# Patient Record
Sex: Female | Born: 1970 | Race: Black or African American | Hispanic: No | Marital: Married | Smoking: Never smoker
Health system: Southern US, Community
[De-identification: ages and names within clinical notes are randomized; demographics above are authoritative.]

## PROBLEM LIST (undated history)

## (undated) DIAGNOSIS — G44229 Chronic tension-type headache, not intractable: Secondary | ICD-10-CM

## (undated) HISTORY — PX: WISDOM TOOTH EXTRACTION: SHX21

## (undated) HISTORY — DX: Chronic tension-type headache, not intractable: G44.229

---

## 2009-09-29 ENCOUNTER — Ambulatory Visit (HOSPITAL_COMMUNITY): Admission: RE | Admit: 2009-09-29 | Discharge: 2009-09-29 | Payer: Self-pay | Admitting: Obstetrics and Gynecology

## 2009-11-03 ENCOUNTER — Inpatient Hospital Stay (HOSPITAL_COMMUNITY): Admission: AD | Admit: 2009-11-03 | Discharge: 2009-11-03 | Payer: Self-pay | Admitting: Obstetrics and Gynecology

## 2009-11-06 ENCOUNTER — Inpatient Hospital Stay (HOSPITAL_COMMUNITY): Admission: AD | Admit: 2009-11-06 | Discharge: 2009-11-06 | Payer: Self-pay | Admitting: Obstetrics and Gynecology

## 2009-11-09 ENCOUNTER — Ambulatory Visit (HOSPITAL_COMMUNITY): Admission: RE | Admit: 2009-11-09 | Discharge: 2009-11-09 | Payer: Self-pay | Admitting: Obstetrics and Gynecology

## 2009-11-16 ENCOUNTER — Ambulatory Visit (HOSPITAL_COMMUNITY): Admission: RE | Admit: 2009-11-16 | Discharge: 2009-11-16 | Payer: Self-pay | Admitting: Obstetrics and Gynecology

## 2009-11-17 DEATH — deceased

## 2009-11-22 ENCOUNTER — Ambulatory Visit (HOSPITAL_COMMUNITY): Admission: RE | Admit: 2009-11-22 | Discharge: 2009-11-22 | Payer: Self-pay | Admitting: Obstetrics and Gynecology

## 2009-11-29 ENCOUNTER — Ambulatory Visit (HOSPITAL_COMMUNITY): Admission: RE | Admit: 2009-11-29 | Discharge: 2009-11-29 | Payer: Self-pay | Admitting: Obstetrics and Gynecology

## 2009-12-06 ENCOUNTER — Ambulatory Visit
Admission: RE | Admit: 2009-12-06 | Discharge: 2009-12-06 | Payer: Self-pay | Source: Home / Self Care | Admitting: Obstetrics and Gynecology

## 2010-01-17 DIAGNOSIS — O009 Unspecified ectopic pregnancy without intrauterine pregnancy: Secondary | ICD-10-CM | POA: Insufficient documentation

## 2010-01-17 HISTORY — DX: Unspecified ectopic pregnancy without intrauterine pregnancy: O00.90

## 2010-03-18 HISTORY — PX: SALPINGECTOMY: SHX328

## 2010-03-19 ENCOUNTER — Encounter (HOSPITAL_COMMUNITY)
Admission: RE | Admit: 2010-03-19 | Discharge: 2010-03-19 | Disposition: A | Discharge status: Home / Self Care | Payer: 59 | Source: Ambulatory Visit | Attending: Obstetrics and Gynecology | Admitting: Obstetrics and Gynecology

## 2010-03-19 DIAGNOSIS — Z01812 Encounter for preprocedural laboratory examination: Secondary | ICD-10-CM | POA: Insufficient documentation

## 2010-03-19 LAB — SURGICAL PCR SCREEN
MRSA, PCR: NEGATIVE
Staphylococcus aureus: NEGATIVE

## 2010-03-19 LAB — CBC
HCT: 39.2 % (ref 36.0–46.0)
RDW: 14.4 % (ref 11.5–15.5)
WBC: 6 10*3/uL (ref 4.0–10.5)

## 2010-03-26 ENCOUNTER — Other Ambulatory Visit: Payer: Self-pay | Admitting: Obstetrics and Gynecology

## 2010-03-26 ENCOUNTER — Ambulatory Visit (HOSPITAL_COMMUNITY)
Admission: RE | Admit: 2010-03-26 | Discharge: 2010-03-26 | Disposition: A | Discharge status: Home / Self Care | Payer: 59 | Source: Ambulatory Visit | Attending: Obstetrics and Gynecology | Admitting: Obstetrics and Gynecology

## 2010-03-26 DIAGNOSIS — Q519 Congenital malformation of uterus and cervix, unspecified: Secondary | ICD-10-CM | POA: Insufficient documentation

## 2010-03-26 LAB — PREGNANCY, URINE: Preg Test, Ur: NEGATIVE

## 2010-03-27 NOTE — H&P (Signed)
Joyce Russell, SUBLETTE                ACCOUNT NO.:  [id number]  MEDICAL RECORD NO.:  [id number]           PATIENT TYPE:  O  LOCATION:  [facility]                           FACILITY:  [facility]  PHYSICIAN:  Crist Fat. Carry Weesner, M.D. DATE OF BIRTH:  21-Mar-1970  DATE OF ADMISSION:  03/19/2010 DATE OF DISCHARGE:  03/19/2010                             HISTORY & PHYSICAL   HISTORY OF PRESENT ILLNESS:  Joyce Russell is a 40 year old married African female para 2-0-1-2, presenting for laparoscopy and right salpingectomy because of a right tubal phimosis and previous history of a right ectopic.  In 2011, the patient underwent evaluation for infertility.  In October of that year, she conceived spontaneously but was found to have a right ectopic that was managed with methotrexate. In view of her desire to conceive again and her previous hysterosalpingogram findings that were consistent with a right paratubal adhesion, the patient has consented to proceed with laparoscopy and right salpingectomy.  OB HISTORY:  Gravida 3, para 2-0-1-2.  GYN HISTORY:  Menarche at 40 years old.  Last menstrual period March 06, 2010.  She currently uses condoms for contraception.  Denies any history of sexually transmitted diseases or abnormal Pap smears.  Last Pap smear was July 2011.  MEDICAL HISTORY:  Migraines.  SURGICAL HISTORY:  Negative.  Denies any history of blood transfusions.  FAMILY HISTORY:  Hypertension, non-insulin-dependent diabetes, stroke, and ovarian cancer.  HABITS:  The patient denies any tobacco, alcohol, or illicit drug use.  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  CURRENT MEDICATIONS: 1. Multivitamins 1 tablet daily. 2. Motrin (over the counter) as needed.  The patient has no known drug allergies.  She further denies any sensitivities to latex, soy, shellfish, or peanuts.  REVIEW OF SYSTEMS:  The patient does have occasional headaches,  however, she denies any chest pain, shortness of breath, pelvic pain, nausea, vomiting, diarrhea, fever, changes in her bowel habits, joint swelling, pain, myalgias, arthralgias, and except as is mentioned in history of present illness, the patient's review of systems is otherwise negative.  PHYSICAL EXAMINATION:  VITAL SIGNS:  Blood pressure 112/72, pulse is 86, respirations 16, temperature 98.4 degrees Fahrenheit orally, weight 165 pounds, height 5 feet 4 inches tall, body mass index 28. NECK:  Supple without masses.  There is no thyromegaly or cervical adenopathy. HEART:  Regular rate and rhythm. LUNGS:  Clear. BACK:  No CVA tenderness. ABDOMEN:  No tenderness, masses, guarding, rebound, or organomegaly. EXTREMITIES:  No clubbing, cyanosis, or edema. PELVIC:  EG/BUS is normal.  Vagina is normal.  Cervix is nontender without lesions.  Uterus appears normal size, shape, and consistency without tenderness.  Adnexa without tenderness or masses.  IMPRESSION: 1. Right tubal phimosis. 2. Status post right ectopic pregnancy  DISPOSITION:  A discussion was held with the patient regarding the indications for her procedures along with their risks, which include but are not limited to, reaction to anesthesia, damage to adjacent organs, infection, and excessive bleeding.  The patient verbalized understanding of these risks and has consented to proceed with laparoscopy followed  by a right salpingectomy at [facility] on March 26, 2010, at 9 o'clock a.m.     Elmira J. Lowell Guitar, P.A.-C   ______________________________ Crist Fat Laurena Valko, M.D.    EJP/MEDQ  D:  03/19/2010  T:  03/20/2010  Job:  [id number]  Electronically Signed by Raylene Everts. on 03/22/2010 10:30:19 AM Electronically Signed by Silverio Lay M.D. on 03/27/2010 02:50:55 PM

## 2010-03-27 NOTE — Op Note (Signed)
Joyce Russell, Joyce Russell                ACCOUNT NO.:  [id number]  MEDICAL RECORD NO.:  [id number]           PATIENT TYPE:  O  LOCATION:  [facility]                          [facility]  [facility]  PHYSICIAN:  Crist Fat. Davonte Siebenaler, M.D. DATE OF BIRTH:  08-10-1970  DATE OF PROCEDURE:  03/26/2010 DATE OF DISCHARGE:                              OPERATIVE REPORT   PREOPERATIVE DIAGNOSIS:  Right tubal phimosis status post right tubal pregnancy.  POSTOPERATIVE DIAGNOSIS:  Right tubal phimosis status post right tubal pregnancy.  ANESTHESIA:  General, Dr. Cristela Blue.  PROCEDURE:  Laparoscopy with right salpingectomy and chromopertubation.  SURGEON:  Crist Fat. Lanay Zinda, MD.  ASSISTANT:  Elmira J. Lowell Guitar, PA.  ESTIMATED BLOOD LOSS:  Minimal.  PROCEDURE:  After being informed of the planned procedure with possible complications including bleeding, infection, injury to other organs, need for laparotomy, informed consent was obtained.  The patient was taken to OR #4, given general anesthesia with endotracheal intubation without any complication.  The patient was then placed in a lithotomy position, prepped and draped in a sterile fashion.  A Foley catheter was inserted in her bladder and a acorn intrauterine manipulator was placed with a tenaculum on the anterior lip of the cervix.  Pelvic exam previously showed a normal uterus and normal adnexa.  We infiltrated the umbilical area with 5 mL of Marcaine 0.25 and performed a semi elliptical incision which was brought down sharply to the fascia.  The fascia was then grasped with Kocher forceps, incised with Mayo scissors.  Peritoneum was entered bluntly.  A pursestring suture of 0 Vicryl was placed on the fascia and a 10-mm Hassan trocar was inserted in the abdominal cavity, held in place with its inflated balloon as well as the pursestring suture.  This allows for easy insufflation of a pneumoperitoneum using warmed CO2 at a maximum pressure of 15 mmHg.   After a quick look we placed a 5-mm suprapubic trocar and a 5-mm right lower quadrant trocar under direct visualization and after infiltration with Marcaine 0.25.  Observation:  Anterior cul-de-sac was normal, posterior cul-de-sac was normal, uterus was normal, right tube was slightly tortuous but free with a normal fimbriae, right ovary was normal left tube was normal with a normal fimbriae, left ovary was normal.  There were no adhesions. Appendix was normal.  Liver was normal.  Gallbladder was normal.  Using our acorn manipulator, we then proceeded with chromopertubation which confirmed our hysterosalpingogram diagnosis with a normal patency and passage in the left tube and collection with hydrosalpinx in the distal portion of the right tube which eventually with extra pressure will leak slightly.  Using a PK gyrus forceps, we then cauterized the tube at the junction with the uterus and sectioned it.  We then cauterized and sectioned the mesosalpinx until the full tube was completely removed through the suprapubic incision.  We then irrigated with saline and noticed satisfactory hemostasis.  All trocars were then removed after evacuating the pneumoperitoneum. The fascia of the umbilical incision was closed with a pursestring suture of 0 Vicryl.  The skin of all 3 incision was then closed with  Dermabond.  Intrauterine manipulator was removed and examination of the cervix revealed bleeding at the site of the tenaculum which was controlled with two figure-of-eight eight stitches of 3-0 Vicryl.  Instrument and sponge count was complete x2.  Estimated blood loss was minimal.  The procedure was well tolerated by the patient who was taken to recovery room in a well and stable condition.  SPECIMEN:  Right tube sent to Pathology.  The procedure was very well tolerated by the patient who was taken to recovery room in a well and stable condition.     Crist Fat Nanie Dunkleberger,  M.D.     SAR/MEDQ  D:  03/26/2010  T:  03/27/2010  Job:  [id number]  Electronically Signed by Silverio Lay M.D. on 03/27/2010 02:51:05 PM

## 2010-03-30 LAB — CBC
MCH: 28.1 pg (ref 26.0–34.0)
MCHC: 33.6 g/dL (ref 30.0–36.0)
MCV: 83.8 fL (ref 78.0–100.0)
Platelets: 267 10*3/uL (ref 150–400)
RDW: 15.7 % — ABNORMAL HIGH (ref 11.5–15.5)
WBC: 5.4 10*3/uL (ref 4.0–10.5)

## 2010-03-31 LAB — CBC
HCT: 39.9 % (ref 36.0–46.0)
MCH: 28 pg (ref 26.0–34.0)
MCHC: 33.4 g/dL (ref 30.0–36.0)
MCV: 83.8 fL (ref 78.0–100.0)
RDW: 15.5 % (ref 11.5–15.5)

## 2010-03-31 LAB — DIFFERENTIAL
Eosinophils Relative: 1 % (ref 0–5)
Lymphocytes Relative: 13 % (ref 12–46)
Lymphs Abs: 1 10*3/uL (ref 0.7–4.0)
Monocytes Absolute: 0.5 10*3/uL (ref 0.1–1.0)
Monocytes Relative: 7 % (ref 3–12)
Neutro Abs: 6.1 10*3/uL (ref 1.7–7.7)

## 2010-03-31 LAB — HCG, QUANTITATIVE, PREGNANCY
hCG, Beta Chain, Quant, S: 3539 m[IU]/mL — ABNORMAL HIGH (ref <5)
hCG, Beta Chain, Quant, S: 7198 m[IU]/mL — ABNORMAL HIGH (ref <5)

## 2010-03-31 LAB — BUN: BUN: 11 mg/dL (ref 6–23)

## 2011-01-18 NOTE — L&D Delivery Note (Signed)
Delivery Note Following AROM at 1527, pt's ctxs cont'd spontaneously and became more intense.  FHT stable and WNL.  Around 1615, pt started to c/o more pressure.  Repositioned pt in tilted, and then lateral position, and cx progressed quickly to 10 cm.  Pushed great to SVD at 4:39 PM.  Secondary to particulate meconium, NICU paged for delivery, but did not arrive until 4 minutes after delivery.  A viable female "Joyce Russell" was delivered via Vaginal, Spontaneous Delivery (Presentation: ; Occiput Anterior).  Newborn w/ spontaneous cry as body delivered and placed on mom's chest.  Mouth and nares bulb suctioned.  APGAR: 9, 9; weight 6 lb 12.1 oz (3065 g).  Cord doubly clamped and cut by FOB.   Placenta status: Intact, Spontaneous, Schultz.  Cord: 3 vessels with the following complications: None.  Cord pH: n/a. Heavy vaginal bleeding following delivery of placenta, so of cytotec placed pr.   Anesthesia: Local  Episiotomy: None Lacerations: 2nd degree;Perineal Suture Repair: 3-0 monocryl Est. Blood Loss (mL):  Mom to postpartum.  Baby to nursery-stable. Breastfeeding initiated in immediate recovery.    Joyce Russell H 04/30/2011, 6:51 AM (Late entry)

## 2011-03-24 ENCOUNTER — Encounter (INDEPENDENT_AMBULATORY_CARE_PROVIDER_SITE_OTHER): Payer: 59 | Admitting: Obstetrics and Gynecology

## 2011-03-24 DIAGNOSIS — Z331 Pregnant state, incidental: Secondary | ICD-10-CM

## 2011-04-07 ENCOUNTER — Encounter (INDEPENDENT_AMBULATORY_CARE_PROVIDER_SITE_OTHER): Payer: 59 | Admitting: Obstetrics and Gynecology

## 2011-04-07 DIAGNOSIS — Z3480 Encounter for supervision of other normal pregnancy, unspecified trimester: Secondary | ICD-10-CM

## 2011-04-14 ENCOUNTER — Encounter (INDEPENDENT_AMBULATORY_CARE_PROVIDER_SITE_OTHER): Payer: 59 | Admitting: Registered Nurse

## 2011-04-14 ENCOUNTER — Encounter: Payer: 59 | Admitting: Obstetrics and Gynecology

## 2011-04-14 DIAGNOSIS — K649 Unspecified hemorrhoids: Secondary | ICD-10-CM

## 2011-04-14 DIAGNOSIS — Z3480 Encounter for supervision of other normal pregnancy, unspecified trimester: Secondary | ICD-10-CM

## 2011-04-22 ENCOUNTER — Encounter (INDEPENDENT_AMBULATORY_CARE_PROVIDER_SITE_OTHER): Payer: 59 | Admitting: Registered Nurse

## 2011-04-22 ENCOUNTER — Encounter: Payer: 59 | Admitting: Obstetrics and Gynecology

## 2011-04-22 DIAGNOSIS — Z331 Pregnant state, incidental: Secondary | ICD-10-CM

## 2011-04-28 ENCOUNTER — Encounter: Payer: Self-pay | Admitting: Obstetrics and Gynecology

## 2011-04-28 ENCOUNTER — Ambulatory Visit (INDEPENDENT_AMBULATORY_CARE_PROVIDER_SITE_OTHER): Payer: 59 | Admitting: Obstetrics and Gynecology

## 2011-04-28 VITALS — BP 124/80 | Ht 63.0 in | Wt 188.0 lb

## 2011-04-28 DIAGNOSIS — Z331 Pregnant state, incidental: Secondary | ICD-10-CM

## 2011-04-28 DIAGNOSIS — O09529 Supervision of elderly multigravida, unspecified trimester: Secondary | ICD-10-CM

## 2011-04-28 NOTE — Progress Notes (Signed)
   The patient  complains of contractions since last night. This am more regular. Now irregular. No change in D/C. Pt desires to schedule IOL if possible on 05/04/11

## 2011-04-28 NOTE — Progress Notes (Signed)
PT C/O CXTS EVERY AFTER 6:OOAM TODAY TIL 9:30 AM. IRREGULAR CXTS SINCE THEN,

## 2011-04-29 ENCOUNTER — Inpatient Hospital Stay (HOSPITAL_COMMUNITY)
Admission: AD | Admit: 2011-04-29 | Discharge: 2011-05-01 | DRG: 774 | Disposition: A | Discharge status: Home / Self Care | Payer: 59 | Source: Ambulatory Visit | Attending: Obstetrics and Gynecology | Admitting: Obstetrics and Gynecology

## 2011-04-29 ENCOUNTER — Encounter (HOSPITAL_COMMUNITY): Payer: Self-pay | Admitting: *Deleted

## 2011-04-29 ENCOUNTER — Inpatient Hospital Stay (HOSPITAL_COMMUNITY): Payer: 59

## 2011-04-29 DIAGNOSIS — IMO0001 Reserved for inherently not codable concepts without codable children: Secondary | ICD-10-CM

## 2011-04-29 DIAGNOSIS — O09529 Supervision of elderly multigravida, unspecified trimester: Secondary | ICD-10-CM | POA: Diagnosis present

## 2011-04-29 DIAGNOSIS — Z8759 Personal history of other complications of pregnancy, childbirth and the puerperium: Secondary | ICD-10-CM

## 2011-04-29 DIAGNOSIS — O479 False labor, unspecified: Secondary | ICD-10-CM

## 2011-04-29 DIAGNOSIS — D649 Anemia, unspecified: Secondary | ICD-10-CM | POA: Diagnosis not present

## 2011-04-29 DIAGNOSIS — O9903 Anemia complicating the puerperium: Secondary | ICD-10-CM | POA: Diagnosis not present

## 2011-04-29 LAB — COMPREHENSIVE METABOLIC PANEL
ALT: 17 U/L (ref 0–35)
AST: 24 U/L (ref 0–37)
Albumin: 2.8 g/dL — ABNORMAL LOW (ref 3.5–5.2)
CO2: 20 mEq/L (ref 19–32)
Calcium: 9.1 mg/dL (ref 8.4–10.5)
Creatinine, Ser: 0.47 mg/dL — ABNORMAL LOW (ref 0.50–1.10)
Sodium: 135 mEq/L (ref 135–145)
Total Protein: 7 g/dL (ref 6.0–8.3)

## 2011-04-29 LAB — RPR
RPR Ser Ql: NONREACTIVE
RPR: NONREACTIVE

## 2011-04-29 LAB — CBC
Hemoglobin: 12.4 g/dL (ref 12.0–15.0)
MCH: 26.7 pg (ref 26.0–34.0)
MCHC: 33.2 g/dL (ref 30.0–36.0)
Platelets: 224 10*3/uL (ref 150–400)

## 2011-04-29 LAB — GC/CHLAMYDIA PROBE AMP, GENITAL

## 2011-04-29 LAB — ANTIBODY SCREEN: Antibody Screen: NEGATIVE

## 2011-04-29 LAB — STREP B DNA PROBE: GBS: NEGATIVE

## 2011-04-29 LAB — HIV ANTIBODY (ROUTINE TESTING W REFLEX): HIV: NONREACTIVE

## 2011-04-29 LAB — URIC ACID: Uric Acid, Serum: 4.1 mg/dL (ref 2.4–7.0)

## 2011-04-29 MED ORDER — MEDROXYPROGESTERONE ACETATE 150 MG/ML IM SUSP: 150.0000 mg | INTRAMUSCULAR | Status: DC | PRN

## 2011-04-29 MED ORDER — OXYCODONE-ACETAMINOPHEN 5-325 MG PO TABS
1.0000 | ORAL_TABLET | ORAL | Status: DC | PRN
  Administered 2011-04-29: 1 via ORAL
  Filled 2011-04-29: qty 1

## 2011-04-29 MED ORDER — PRENATAL MULTIVITAMIN CH
1.0000 | ORAL_TABLET | Freq: Every day | ORAL | Status: DC
  Administered 2011-04-29 – 2011-05-01 (×3): 1 via ORAL
  Filled 2011-04-29 (×2): qty 1

## 2011-04-29 MED ORDER — TETANUS-DIPHTH-ACELL PERTUSSIS 5-2.5-18.5 LF-MCG/0.5 IM SUSP
0.5000 mL | Freq: Once | INTRAMUSCULAR | Status: AC
  Administered 2011-05-01: 0.5 mL via INTRAMUSCULAR

## 2011-04-29 MED ORDER — MAGNESIUM HYDROXIDE 400 MG/5ML PO SUSP: 30.0000 mL | ORAL | Status: DC | PRN

## 2011-04-29 MED ORDER — OXYTOCIN 20 UNITS IN LACTATED RINGERS INFUSION - SIMPLE
125.0000 mL/h | Freq: Once | INTRAVENOUS | Status: AC
  Administered 2011-04-29: 999 mL/h via INTRAVENOUS

## 2011-04-29 MED ORDER — LIDOCAINE HCL (PF) 1 % IJ SOLN
30.0000 mL | INTRAMUSCULAR | Status: DC | PRN
  Administered 2011-04-29: 30 mL via SUBCUTANEOUS
  Filled 2011-04-29 (×2): qty 30

## 2011-04-29 MED ORDER — IBUPROFEN 600 MG PO TABS
600.0000 mg | ORAL_TABLET | Freq: Four times a day (QID) | ORAL | Status: DC
  Administered 2011-04-30 – 2011-05-01 (×6): 600 mg via ORAL
  Filled 2011-04-29 (×6): qty 1

## 2011-04-29 MED ORDER — CITRIC ACID-SODIUM CITRATE 334-500 MG/5ML PO SOLN: 30.0000 mL | ORAL | Status: DC | PRN

## 2011-04-29 MED ORDER — LACTATED RINGERS IV SOLN
INTRAVENOUS | Status: DC
  Administered 2011-04-29: 13:00:00 via INTRAVENOUS

## 2011-04-29 MED ORDER — WITCH HAZEL-GLYCERIN EX PADS: 1.0000 "application " | MEDICATED_PAD | CUTANEOUS | Status: DC | PRN

## 2011-04-29 MED ORDER — LANOLIN HYDROUS EX OINT: TOPICAL_OINTMENT | CUTANEOUS | Status: DC | PRN

## 2011-04-29 MED ORDER — HYDROXYZINE HCL 50 MG/ML IM SOLN: 50.0000 mg | Freq: Four times a day (QID) | INTRAMUSCULAR | Status: DC | PRN

## 2011-04-29 MED ORDER — ONDANSETRON HCL 4 MG/2ML IJ SOLN: 4.0000 mg | INTRAMUSCULAR | Status: DC | PRN

## 2011-04-29 MED ORDER — HYDROXYZINE HCL 50 MG PO TABS: 50.0000 mg | ORAL_TABLET | Freq: Four times a day (QID) | ORAL | Status: DC | PRN

## 2011-04-29 MED ORDER — MISOPROSTOL 200 MCG PO TABS
ORAL_TABLET | ORAL | Status: AC
  Administered 2011-04-29: 800 ug via VAGINAL
  Filled 2011-04-29: qty 4

## 2011-04-29 MED ORDER — ACETAMINOPHEN 325 MG PO TABS: 650.0000 mg | ORAL_TABLET | ORAL | Status: DC | PRN

## 2011-04-29 MED ORDER — LACTATED RINGERS IV SOLN: 500.0000 mL | INTRAVENOUS | Status: DC | PRN

## 2011-04-29 MED ORDER — OXYTOCIN BOLUS FROM INFUSION
500.0000 mL | Freq: Once | INTRAVENOUS | Status: DC
  Filled 2011-04-29: qty 1000
  Filled 2011-04-29: qty 500
  Filled 2011-04-29: qty 1000

## 2011-04-29 MED ORDER — ONDANSETRON HCL 4 MG PO TABS: 4.0000 mg | ORAL_TABLET | ORAL | Status: DC | PRN

## 2011-04-29 MED ORDER — IBUPROFEN 600 MG PO TABS: 600.0000 mg | ORAL_TABLET | Freq: Four times a day (QID) | ORAL | Status: DC | PRN

## 2011-04-29 MED ORDER — MISOPROSTOL 200 MCG PO TABS
800.0000 ug | ORAL_TABLET | Freq: Once | ORAL | Status: AC
  Administered 2011-04-29: 800 ug via VAGINAL

## 2011-04-29 MED ORDER — FLEET ENEMA 7-19 GM/118ML RE ENEM: 1.0000 | ENEMA | RECTAL | Status: DC | PRN

## 2011-04-29 MED ORDER — ONDANSETRON HCL 4 MG/2ML IJ SOLN: 4.0000 mg | Freq: Four times a day (QID) | INTRAMUSCULAR | Status: DC | PRN

## 2011-04-29 MED ORDER — SIMETHICONE 80 MG PO CHEW: 80.0000 mg | CHEWABLE_TABLET | ORAL | Status: DC | PRN

## 2011-04-29 MED ORDER — DIBUCAINE 1 % RE OINT: 1.0000 "application " | TOPICAL_OINTMENT | RECTAL | Status: DC | PRN

## 2011-04-29 MED ORDER — SENNOSIDES-DOCUSATE SODIUM 8.6-50 MG PO TABS
2.0000 | ORAL_TABLET | Freq: Every day | ORAL | Status: DC
  Administered 2011-04-29 – 2011-04-30 (×2): 2 via ORAL

## 2011-04-29 MED ORDER — DIPHENHYDRAMINE HCL 25 MG PO CAPS: 25.0000 mg | ORAL_CAPSULE | Freq: Four times a day (QID) | ORAL | Status: DC | PRN

## 2011-04-29 MED ORDER — BUTORPHANOL TARTRATE 2 MG/ML IJ SOLN: 1.0000 mg | INTRAMUSCULAR | Status: DC | PRN

## 2011-04-29 MED ORDER — BENZOCAINE-MENTHOL 20-0.5 % EX AERO
1.0000 "application " | INHALATION_SPRAY | CUTANEOUS | Status: DC | PRN
  Administered 2011-04-29: 1 via TOPICAL

## 2011-04-29 MED ORDER — ZOLPIDEM TARTRATE 5 MG PO TABS: 5.0000 mg | ORAL_TABLET | Freq: Every evening | ORAL | Status: DC | PRN

## 2011-04-29 MED ORDER — BENZOCAINE-MENTHOL 20-0.5 % EX AERO
INHALATION_SPRAY | CUTANEOUS | Status: AC
  Filled 2011-04-29: qty 56

## 2011-04-29 MED ORDER — OXYCODONE-ACETAMINOPHEN 5-325 MG PO TABS: 1.0000 | ORAL_TABLET | ORAL | Status: DC | PRN

## 2011-04-29 NOTE — MAU Provider Note (Signed)
  History   Joyce Russell is a 41y.o. AAF who presents at 39.5 Weeks, unannounced for labor check.  Reports onset of ctxs Wed, but have come and then go away.  Restarted again this morning around 0400, and have been about every 5-10 min.  Cx at her appt yesterday by Dr. Forestine Chute.  Pt denies LOF or VB; no recent illness.  Denies UTI or PIH s/s.  GFM.  Ate "light" breakfast this AM.   Pregnancy r/f: 1.  H/o ectopic 2. H/o migraines 3.  AMA 4.  LVEIF--resolved 5.  H/o ov. cyst  CSN: [id number]  Arrival date and time: 04/29/11 0916   None     Chief Complaint  Patient presents with  . Labor Eval   HPI  OB History    Grav Para Term Preterm Abortions TAB SAB Ect Mult Living   4 2 2  0 1 0 0 1 0 2      Past Medical History  Diagnosis Date  . Ectopic pregnancy 2012    Past Surgical History  Procedure Date  . Laparoscopic oophorectomy 2012    History reviewed. No pertinent family history.  History  Substance Use Topics  . Smoking status: Not on file  . Smokeless tobacco: Not on file  . Alcohol Use: Not on file    Allergies: No Known Allergies  Prescriptions prior to admission  Medication Sig Dispense Refill  . Prenatal Vit-Fe Fumarate-FA (PRENATAL MULTIVITAMIN) TABS Take 1 tablet by mouth daily.        ROS--see history above Physical Exam   Blood pressure 138/85, pulse 75, temperature 96.1 F (35.6 C), temperature source Oral, resp. rate 20, height 5\' 3"  (1.6 m), weight 84.879 kg (187 lb 2 oz), last menstrual period 07/25/2010, SpO2 100.00%.  Physical Exam  Constitutional: She is oriented to person, place, and time. She appears well-developed and well-nourished. No distress.  Cardiovascular: Normal rate.   Respiratory: Effort normal.  GI: Soft.       gravid  Genitourinary:       Cx:  3/60-70/-2; posterior  Neurological: She is alert and oriented to person, place, and time.  Skin: Skin is warm and dry.   EFM: 135, reactive, mod variability, no decels except  when pt on back for cervical exam TOCO:  Irregular, but not tracing well MAU Course  Procedures 1.  NST  Assessment and Plan  1.  IUP at 39.5 2.  Early vs prodromal labor 3. Cat 1 FHT 4.  No cervical change since her appt yesterday  1.  D/c home w/ labor precautions and FKC 2. F/u next Wednesday at office as scheduled or prn 3.  Tylenol PM for rest prn  [place] Hagger H 04/29/2011, 9:59 AM

## 2011-04-29 NOTE — MAU Note (Signed)
Onset UC since Wed, today @ 4a, more intense and regular, 5-7 min

## 2011-04-29 NOTE — Consult Note (Signed)
Late Entry:  Called via beeper to attend delivery of this baby for MSF. RT and I came right away after notice. Arrived in room and I was notified the baby was already 4 min old but did well. Was bulb suctioned and has been stable. Infant at this time was in mom's belly and appeared comfortable. Team was excused.  Joyce Russell Q       

## 2011-04-29 NOTE — Progress Notes (Signed)
[facility], cnm called and notified we have no prenatal lab results in the system.  CNM is aware and will obtain from office.  Notified pt is breathing with uc's but not requesting anything for pain relief at this time.

## 2011-04-29 NOTE — Progress Notes (Signed)
B/c of questionable decel while checking pt's cx, pt left on monitor for extended monitoring, and since, has had some additional questionable decels, some w/ ctxs.  FHT is minimally reactive, but overall reassuring.  Will obtain BPP now, and if WNL, will d/c home.

## 2011-04-29 NOTE — Progress Notes (Signed)
Joyce Russell is a 41 y.o. [id number] at [redacted]w[redacted]d by  admitted for active labor  Subjective: Labor has progressed spontaneously.  Coping well w/ ctxs; breathing and shifting in bed w/ all now.  Husband is at bedside.    Objective: BP 139/87  Pulse 79  Temp(Src) 97.8 F (36.6 C) (Oral)  Resp 20  Ht 5\' 3"  (1.6 m)  Wt 84.879 kg (187 lb 2 oz)  BMI 33.15 kg/m2  SpO2 100%  LMP 07/25/2010      FHT:  FHR: 135 bpm, variability: moderate,  accelerations:  Present,  decelerations:  Absent UC:   regular, every 2-4 minutes SVE:   Dilation: 8 Effacement (%): 80 Station: -2 Exam by:: [employer], cnm AROM:  Small amt of particulate meconium Labs: Lab Results  Component Value Date   WBC 7.1 04/29/2011   HGB 12.4 04/29/2011   HCT 37.4 04/29/2011   MCV 80.4 04/29/2011   PLT 224 04/29/2011    Assessment / Plan: Spontaneous labor, progressing normally 1.  39.5  2.  GBS neg  3.  Particulate meconium  4.  AMA Labor: Progressing normally Preeclampsia:  no signs or symptoms of toxicity Fetal Wellbeing:  Category I Pain Control:  Labor support without medications I/D:  n/a Anticipated MOD:  NSVD 1.  Plan NICU at delivery 2.  C/w MD prn. Brice Kossman H 04/29/2011, 3:34 PM

## 2011-04-29 NOTE — Progress Notes (Signed)
Delivery call made at 1636 and team arrived after delivery at 1641.

## 2011-04-29 NOTE — Progress Notes (Signed)
Error in charting.

## 2011-04-30 LAB — CBC
HCT: 27.2 % — ABNORMAL LOW (ref 36.0–46.0)
Hemoglobin: 8.9 g/dL — ABNORMAL LOW (ref 12.0–15.0)
RBC: 3.4 MIL/uL — ABNORMAL LOW (ref 3.87–5.11)

## 2011-04-30 MED ORDER — METHYLERGONOVINE MALEATE 0.2 MG PO TABS
0.2000 mg | ORAL_TABLET | Freq: Four times a day (QID) | ORAL | Status: AC
  Administered 2011-04-30 (×4): 0.2 mg via ORAL
  Filled 2011-04-30 (×4): qty 1

## 2011-04-30 MED ORDER — POLYSACCHARIDE IRON COMPLEX 150 MG PO CAPS
150.0000 mg | ORAL_CAPSULE | Freq: Every day | ORAL | Status: DC
  Administered 2011-04-30 – 2011-05-01 (×2): 150 mg via ORAL
  Filled 2011-04-30 (×2): qty 1

## 2011-04-30 NOTE — Progress Notes (Addendum)
Post Partum Day 1--s/p SVB Subjective: Doing well.  Up ad lib.  No syncope or dizziness.  Remained with moderate lochia through night, no clots expressed on exam.  Breastfeeding.  IV had dislodged--removed.  Objective: Blood pressure 122/81, pulse 83, temperature 98.2 F (36.8 C), temperature source Oral, resp. rate 20, height 5\' 3"  (1.6 m), weight 187 lb 2 oz (84.879 kg), last menstrual period 07/25/2010, SpO2 98.00%.  Filed Vitals:   04/29/11 1900 04/29/11 2005 04/30/11 0013 04/30/11 0523  BP: 129/83 127/84 103/66 122/81  Pulse: 69 90 87 83  Temp: 98 F (36.7 C) 98.3 F (36.8 C) 98.5 F (36.9 C) 98.2 F (36.8 C)  TempSrc:  Oral Oral Oral  Resp: 18 20 20 20   Height:      Weight:      SpO2:  98% 98%     Physical Exam:  General: alert Lochia: moderate Uterine Fundus: firm, no clots expressed on my exam. Incision: healing well DVT Evaluation: No evidence of DVT seen on physical exam. Negative Homan's sign.   Basename 04/30/11 0520 04/29/11 1410  HGB 8.9* 12.4  HCT 27.2* 37.4    Assessment/Plan: Stable pp day 1 Anemia without hemodynamic instability Moderate lochia  Plan: Check orthostatics Niferex qday Repeat CBC in am. Methergine po course for prophylaxis. Anticipate d/c tomorrow.   LOS: 1 day   Nigel Bridgeman 04/30/2011, 7:46 AM

## 2011-04-30 NOTE — Progress Notes (Signed)
Noted patient is still having moderate amount of lochia. Patient is not passing any clots and is asymptomatic. Notified CNM on call at [facility]

## 2011-05-01 ENCOUNTER — Encounter (HOSPITAL_COMMUNITY): Payer: Self-pay | Admitting: *Deleted

## 2011-05-01 LAB — CBC
HCT: 29.1 % — ABNORMAL LOW (ref 36.0–46.0)
Hemoglobin: 9.4 g/dL — ABNORMAL LOW (ref 12.0–15.0)
MCH: 26 pg (ref 26.0–34.0)
MCV: 80.6 fL (ref 78.0–100.0)
RBC: 3.61 MIL/uL — ABNORMAL LOW (ref 3.87–5.11)
WBC: 8.7 10*3/uL (ref 4.0–10.5)

## 2011-05-01 NOTE — Discharge Summary (Signed)
Obstetric Discharge Summary Reason for Admission: onset of labor and FHR decelerations Prenatal Procedures: ultrasound and BPP Intrapartum Procedures: spontaneous vaginal delivery Postpartum Procedures: none Complications-Operative and Postpartum: 2 degree perineal laceration and mild hemorrhage, resolved w/ PO Methergine. Pt asymptomatic.  Hemoglobin  Date Value Range Status  05/01/2011 9.4* 12.0-15.0 (g/dL) Final     HCT  Date Value Range Status  05/01/2011 29.1* 36.0-46.0 (%) Final    Physical Exam:  Temp:  [98 F (36.7 C)-98.6 F (37 C)] 98.6 F (37 C) (04/14 0534) Pulse Rate:  [77-85] 85  (04/14 0534) Resp:  [18] 18  (04/14 0534) BP: (111-143)/(73-86) 111/73 mmHg (04/14 0534) No orthostatic changes General: alert, cooperative and no distress Lochia: appropriate Uterine Fundus: firm Incision: healing well DVT Evaluation: Negative Homan's sign. Calf/Ankle edema is present.  Discharge Diagnoses: Term Pregnancy-delivered and Mild postpartum hemorrhage, asymptomatic anemia  Discharge Information: Date: 05/01/2011 Activity: pelvic rest Diet: routine and increase dietary iron Medications: PNV, Ibuprofen and Colace, iron BID Condition: stable Instructions: refer to practice specific booklet Discharge to: home Follow-up Information    Follow up with [facility] in 6 weeks. (or call as needed with any questions or concerns)          Newborn Data: Live born female  Birth Weight: 6 lb 12.1 oz (3065 g) APGAR: 9, 9  Home with mother.  Rennie Rouch 05/01/2011, 9:21 AM

## 2011-05-22 ENCOUNTER — Encounter (HOSPITAL_COMMUNITY): Payer: Self-pay

## 2011-05-22 NOTE — H&P (Signed)
Joyce Russell is a 41 y.o.married AA female who presents at 57.5 Weeks, unannounced for labor check. Reports onset of ctxs Wed, but have been intermittent; ctxs restarted again this morning around 0400, and have been about every 5-10 min. Cx at her appt yesterday by Dr. Forestine Russell. Pt denies LOF or VB; no recent illness. Denies UTI or PIH s/s. GFM. Ate "light" breakfast this AM. Accompanied to [facility] by her husband.   Pt's cervix was unchanged on arrival to MAU, but tracing minimally reactive and questionable decels, so per c/w dr. Estanislado Russell, BPP obtained.  While awaiting BPP result, pt's labor continued to progress spontaneously, and cervix changed, therefore leading to admission for active labor.  Pt started prenatal care at [facility] around 5 weeks and had 1st trimester u/s which concurred w/ EDC by LMP.  She had a normal 1st trimester screen and AFP, as well as anatomy scan.  U/s did show simple Lt ov cyst in 1st trimester, and anatomy showed fetal LVEIF, but both conditions had resolved on f/u u/s at 28 weeks.  Her pregnancy has been overall uncomplicated.  She has had worsening hemorrhoids, and in last few weeks started using proctofoam HC.    OB Hx: G1=SVD '3 Female "Joyce Russell" at 40.1 weeks BW=6+14 at [facility]; 3hr labor G2=SVD '99 female "Joyce Russell" at 40 weeks, BW=&+2 at [place]; 4 hr labor G3=ectopic in '12; Rt salpingectomy (laparoscopic) and Methotrexate G4=current . Maternal Medical History:  Reason for admission: Reason for admission: contractions.  Contractions: Onset was more than 2 days ago.   Frequency: regular.   Perceived severity is moderate.    Fetal activity: Perceived fetal activity is normal.   Last perceived fetal movement was within the past hour.    Prenatal complications: 1.  AMA 2.  Russell/o migraines 3.  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 Lt ov cyst 1st trimester--resolved by 28 weeks 6.  LVEIF on anatomy--resolved by 28 weeks 7. Russell/o rapid labor 8.  Remote  Russell/o abnl pap     OB History as of 05/01/11    Grav Para Term Preterm Abortions TAB SAB Ect Mult Living   4 3 3  0 1 0 0 1 0 3     Past Medical History  Diagnosis Date  . Ectopic pregnancy 2012   Past Surgical History  Procedure Date  . Laparoscopic oophorectomy 2012  . Wisdom tooth extraction    Family History: sister:  Tachycardia; Mom: HTN; MGF:  HTN, DM, CVA; Dad:  DM; P. Aunts:  DM; PGM:  Uterine CA.   Social History:  reports that she has never smoked. She does not have any smokeless tobacco history on file. 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  Her husband, "Joyce Russell" has his Energy manager and is self-employed.  Review of Systems  Constitutional: Negative.   HENT: Negative.   Eyes: Negative.   Cardiovascular: Negative.   Gastrointestinal:       Hemorrhoids--using proctofoam the last few weeks  Genitourinary: Negative.   Skin: Negative.   Neurological: Negative.     Maternal Exam:  Uterine Assessment: Contraction strength is moderate.  Contraction frequency is regular.  UC's every 5-7  Abdomen: Patient reports no abdominal tenderness. Estimated fetal weight is 6.5-7.5 lbs.   Fetal presentation: vertex  Introitus: Normal vulva. Pelvis: adequate for delivery.   Cervix: Cervix evaluated by digital exam.     Fetal  Exam Fetal Monitor Review: Mode: ultrasound.   Baseline rate: 135.  Variability: moderate (6-25 bpm).   Pattern: accelerations present.   10x10 accels mainly in MAU; decels appear to be mild variables overall and/or earlies  Fetal State Assessment: Category II - tracings are indeterminate.     Physical Exam  Constitutional: She is oriented to person, place, and time. She appears well-developed and well-nourished. She appears distressed.       Pt w/ more heavily labored breathing as time progressed in MAU and more squirmy in bed   Cardiovascular: Normal rate.   Respiratory:  Effort normal.  GI: Soft.       gravid  Genitourinary:       cx initially 3cm; recheck about 2-3 hrs later and 5-6cm  Musculoskeletal: Normal range of motion.  Neurological: She is alert and oriented to person, place, and time. She has normal reflexes.  Skin: Skin is warm and dry.  Psychiatric: She has a normal mood and affect. Her behavior is normal. Judgment and thought content normal.    Prenatal labs: ABO, Rh:  A positive Antibody: Negative (04/12 0000) Rubella: Immune (04/12 0000) RPR: NON REACTIVE (04/12 1410)  HBsAg: Negative (04/12 0000)  HIV: Non-reactive (04/12 0000)  GBS: Negative (04/12 0000)  Joyce Russell screen negative 1hr gtt=99 Hgb at 1hr=12  Assessment/Plan: 1.  39.5 weeks 2.  Active labor 3.  GBS neg 4.  Questionable decels in MAU, w/ 6/8 BPP before noting cervical change and thus need for admission 5.  Cat 1-2 FHT  6.  Desires natural CB 7.  Mildly elevated BP w/ no previous history  1.  Admit to [facility] w/ Dr. Estanislado Russell as attending 2. Routine L&D orders with PIH labs 3.  Pain interventions prn if pt desires; support as needed 4.  Continue close observation of FHT and BP's 5.  AROM prn augmentation 6.  SVd anticipated 7.  C/w MD prn  Joyce Russell 04/29/11, 1330

## 2011-06-09 ENCOUNTER — Encounter: Payer: Self-pay | Admitting: Obstetrics and Gynecology

## 2011-06-09 ENCOUNTER — Ambulatory Visit (INDEPENDENT_AMBULATORY_CARE_PROVIDER_SITE_OTHER): Payer: 59 | Admitting: Obstetrics and Gynecology

## 2011-06-09 VITALS — BP 112/78 | Wt 168.0 lb

## 2011-06-09 DIAGNOSIS — Z124 Encounter for screening for malignant neoplasm of cervix: Secondary | ICD-10-CM

## 2011-06-09 NOTE — Patient Instructions (Signed)
Patient Education Materials to be provided at check out (*indicates is located in Garment/textile technologist):  ACOG IUD

## 2011-06-09 NOTE — Progress Notes (Signed)
Joyce Russell  is 5 week postpartum following 6 days  a spontaneous vaginal delivery at 39w 5 d  gestational weeks Date: 04/29/11 female baby named Joyce Russell delivered by [employer] .  Breastfeeding: yes Bottlefeeding:  no  Post-partum blues / depression:  no  EPDS score: 0  History of abnormal Pap:  no  Last Pap: Date  09/09/10 Gestational diabetes:  no  Contraception: Discuss IUD   Normal urinary function:  yes Normal GI function:  Constipation sometimes per pt  Returning to work:  Yes  Subjective:     Joyce Russell is a 41 y.o. female who presents for a postpartum visit.  I have fully reviewed the prenatal and intrapartum course.    Patient is not sexually active.   The following portions of the patient's history were reviewed and updated as appropriate: allergies, current medications, past family history, past medical history, past social history, past surgical history and problem list.  Review of Systems Pertinent items are noted in HPI.   Objective:    BP 112/78  Wt 168 lb (76.204 kg)  Breastfeeding? Yes  General:  alert, cooperative and no distress     Lungs: clear to auscultation bilaterally  Heart:  regular rate and rhythm, S1, S2 normal, no murmur  Abdomen: soft, non-tender; bowel sounds normal; no masses,  no organomegaly   Vulva:  normal  Vagina: normal vagina  Cervix:  normal  Corpus: normal size, contour, position, consistency, mobility, non-tender AV  Adnexa:  normal adnexa             Assessment:     Normal postpartum exam.  Pap smear done at today's visit.   Plan:     1. Contraception: IUD  Reviewed Paragard with expected benefits of: lack of estrogen,10 year duration, high reliability at 99.9%, reversibility. Risks at the time of insertion were reviewed: dysfunctional uterine bleeding with increased flow, infection and uterine perforation which may require laparoscopy for retrieval.  Instructions for day of insertion discussed:   yes avoidance of unprotected intercourse 2 weeks prior, best to schedule during a menstrual cycle and use of Ibuprofen 600 mg 1 hour before appointment.  3. Follow up in:   For insertion    Franziska Podgurski A MD 06/09/2011 11:37 AM

## 2011-06-16 LAB — PAP IG, CT-NG, RFX HPV ASCU
Chlamydia Probe Amp: NEGATIVE
GC Probe Amp: NEGATIVE

## 2011-07-11 ENCOUNTER — Encounter: Payer: 59 | Admitting: Obstetrics and Gynecology

## 2011-07-20 ENCOUNTER — Encounter: Payer: Self-pay | Admitting: Obstetrics and Gynecology

## 2011-07-20 ENCOUNTER — Ambulatory Visit (INDEPENDENT_AMBULATORY_CARE_PROVIDER_SITE_OTHER): Payer: 59 | Admitting: Obstetrics and Gynecology

## 2011-07-20 VITALS — BP 120/78 | HR 72 | Wt 171.0 lb

## 2011-07-20 DIAGNOSIS — B1910 Unspecified viral hepatitis B without hepatic coma: Secondary | ICD-10-CM | POA: Insufficient documentation

## 2011-07-20 DIAGNOSIS — IMO0001 Reserved for inherently not codable concepts without codable children: Secondary | ICD-10-CM

## 2011-07-20 DIAGNOSIS — R519 Headache, unspecified: Secondary | ICD-10-CM | POA: Insufficient documentation

## 2011-07-20 DIAGNOSIS — Z30430 Encounter for insertion of intrauterine contraceptive device: Secondary | ICD-10-CM

## 2011-07-20 DIAGNOSIS — O09529 Supervision of elderly multigravida, unspecified trimester: Secondary | ICD-10-CM | POA: Insufficient documentation

## 2011-07-20 DIAGNOSIS — R51 Headache: Secondary | ICD-10-CM | POA: Insufficient documentation

## 2011-07-20 DIAGNOSIS — Z8619 Personal history of other infectious and parasitic diseases: Secondary | ICD-10-CM | POA: Insufficient documentation

## 2011-07-20 DIAGNOSIS — IMO0002 Reserved for concepts with insufficient information to code with codable children: Secondary | ICD-10-CM | POA: Insufficient documentation

## 2011-07-20 LAB — POCT URINE PREGNANCY: Preg Test, Ur: NEGATIVE

## 2011-07-20 MED ORDER — PARAGARD INTRAUTERINE COPPER IU IUD
1.0000 | INTRAUTERINE_SYSTEM | Freq: Once | INTRAUTERINE | Status: AC
  Administered 2011-07-20: 1 via INTRAUTERINE

## 2011-07-20 NOTE — Progress Notes (Signed)
IUD INSERTION NOTE   Consent signed after risks and benefits were reviewed including but not limited to bleeding, infection and risk of uterine perforation.  LMP: No LMP recorded. Patient is not currently having periods (Reason: Lactating). UPT: Negative GC / Chlamydia: negative  Prepping with Betadine Tenaculum placed on anterior lip of cervix Uterus sounded at  8.5 cm Insertion of MIRENA IUD per protocol without any complications  POST-PROCEDURE:  Patient instructed to call with fever or excessive pain Patient instructed to check IUD strings after each menstrual cycle   Follow-up: 2 weeks   Terald Jump A MD 07/20/2011 3:24 PM

## 2011-08-03 ENCOUNTER — Ambulatory Visit (INDEPENDENT_AMBULATORY_CARE_PROVIDER_SITE_OTHER): Payer: 59 | Admitting: Obstetrics and Gynecology

## 2011-08-03 ENCOUNTER — Encounter: Payer: Self-pay | Admitting: Obstetrics and Gynecology

## 2011-08-03 VITALS — BP 122/80 | HR 74 | Wt 174.0 lb

## 2011-08-03 DIAGNOSIS — Z30431 Encounter for routine checking of intrauterine contraceptive device: Secondary | ICD-10-CM

## 2011-08-03 LAB — POCT URINE PREGNANCY: Preg Test, Ur: NEGATIVE

## 2011-08-03 NOTE — Progress Notes (Signed)
41 YO with Mirena IUD inserted 07/20/11 ( Dr. Estanislado Pandy) returns for follow-up. Denies any pain, irregular bleeding or complaints from partner.   O: Pelvic: EGBUS-wnl, vagina-normal, cervix-string visible, uterus-ULNS without tenderness, adnexae-no masses or tenderness   A: IUD Check  P: RTO-AEx or prn

## 2012-07-03 ENCOUNTER — Other Ambulatory Visit: Payer: Self-pay

## 2012-07-03 DIAGNOSIS — Z1231 Encounter for screening mammogram for malignant neoplasm of breast: Secondary | ICD-10-CM

## 2012-08-08 ENCOUNTER — Ambulatory Visit: Payer: 59

## 2012-10-12 ENCOUNTER — Other Ambulatory Visit (INDEPENDENT_AMBULATORY_CARE_PROVIDER_SITE_OTHER): Payer: 59

## 2012-10-12 ENCOUNTER — Ambulatory Visit (INDEPENDENT_AMBULATORY_CARE_PROVIDER_SITE_OTHER): Payer: 59 | Admitting: Internal Medicine

## 2012-10-12 ENCOUNTER — Encounter: Payer: Self-pay | Admitting: Internal Medicine

## 2012-10-12 VITALS — BP 122/90 | HR 68 | Temp 97.2°F | Ht 64.0 in | Wt 182.4 lb

## 2012-10-12 DIAGNOSIS — G44229 Chronic tension-type headache, not intractable: Secondary | ICD-10-CM | POA: Insufficient documentation

## 2012-10-12 DIAGNOSIS — Z0000 Encounter for general adult medical examination without abnormal findings: Secondary | ICD-10-CM

## 2012-10-12 LAB — CBC WITH DIFFERENTIAL/PLATELET
Basophils Absolute: 0 10*3/uL (ref 0.0–0.1)
HCT: 37.4 % (ref 36.0–46.0)
Lymphs Abs: 1.4 10*3/uL (ref 0.7–4.0)
Monocytes Absolute: 0.4 10*3/uL (ref 0.1–1.0)
Monocytes Relative: 10.7 % (ref 3.0–12.0)
Neutrophils Relative %: 52.7 % (ref 43.0–77.0)
Platelets: 245 10*3/uL (ref 150.0–400.0)
RDW: 16.6 % — ABNORMAL HIGH (ref 11.5–14.6)

## 2012-10-12 LAB — URINALYSIS, ROUTINE W REFLEX MICROSCOPIC
Bilirubin Urine: NEGATIVE
Leukocytes, UA: NEGATIVE
Nitrite: NEGATIVE
pH: 6 (ref 5.0–8.0)

## 2012-10-12 LAB — BASIC METABOLIC PANEL
BUN: 14 mg/dL (ref 6–23)
Creatinine, Ser: 0.6 mg/dL (ref 0.4–1.2)
GFR: 139.3 mL/min (ref >60.00)
Glucose, Bld: 86 mg/dL (ref 70–99)
Potassium: 4.1 mEq/L (ref 3.5–5.1)

## 2012-10-12 LAB — HEPATIC FUNCTION PANEL
ALT: 43 U/L — ABNORMAL HIGH (ref 0–35)
AST: 24 U/L (ref 0–37)
Alkaline Phosphatase: 48 U/L (ref 39–117)
Bilirubin, Direct: 0 mg/dL (ref 0.0–0.3)
Total Bilirubin: 0.4 mg/dL (ref 0.3–1.2)

## 2012-10-12 LAB — LIPID PANEL: HDL: 43.8 mg/dL (ref >39.00)

## 2012-10-12 MED ORDER — ISOMETHEPTENE-DICHLORAL-APAP 65-100-325 MG PO CAPS: 1.0000 | ORAL_CAPSULE | Freq: Four times a day (QID) | ORAL | Status: DC | PRN

## 2012-10-12 NOTE — Patient Instructions (Addendum)
It was good to see you today. Health Maintenance reviewed - all recommended immunizations and age-appropriate screenings are up-to-date. We have reviewed your prior records including labs and tests today Test(s) ordered today. Your results will be released to MyChart (or called to you) after review, usually within 72hours after test completion. If any changes need to be made, you will be notified at that same time. Medications reviewed and updated, Okay to use Midrin as needed for headache symptoms - no other changes recommended at this time. Your prescription(s) have been submitted to your pharmacy. Please take as directed and contact our office if you believe you are having problem(s) with the medication(s). Please schedule followup in 1-2 years for annual physical and labs, call sooner if problems.  Health Maintenance, Females A healthy lifestyle and preventative care can promote health and wellness.  Maintain regular health, dental, and eye exams.  Eat a healthy diet. Foods like vegetables, fruits, whole grains, low-fat dairy products, and lean protein foods contain the nutrients you need without too many calories. Decrease your intake of foods high in solid fats, added sugars, and salt. Get information about a proper diet from your caregiver, if necessary.  Regular physical exercise is one of the most important things you can do for your health. Most adults should get at least 150 minutes of moderate-intensity exercise (any activity that increases your heart rate and causes you to sweat) each week. In addition, most adults need muscle-strengthening exercises on 2 or more days a week.   Maintain a healthy weight. The body mass index (BMI) is a screening tool to identify possible weight problems. It provides an estimate of body fat based on height and weight. Your caregiver can help determine your BMI, and can help you achieve or maintain a healthy weight. For adults 20 years and older:  A BMI  below 18.5 is considered underweight.  A BMI of 18.5 to 24.9 is normal.  A BMI of 25 to 29.9 is considered overweight.  A BMI of 30 and above is considered obese.  Maintain normal blood lipids and cholesterol by exercising and minimizing your intake of saturated fat. Eat a balanced diet with plenty of fruits and vegetables. Blood tests for lipids and cholesterol should begin at age 42 and be repeated every 5 years. If your lipid or cholesterol levels are high, you are over 50, or you are a high risk for heart disease, you may need your cholesterol levels checked more frequently.Ongoing high lipid and cholesterol levels should be treated with medicines if diet and exercise are not effective.  If you smoke, find out from your caregiver how to quit. If you do not use tobacco, do not start.  If you are pregnant, do not drink alcohol. If you are breastfeeding, be very cautious about drinking alcohol. If you are not pregnant and choose to drink alcohol, do not exceed 1 drink per day. One drink is considered to be 12 ounces (355 mL) of beer, 5 ounces (148 mL) of wine, or 1.5 ounces (44 mL) of liquor.  Avoid use of street drugs. Do not share needles with anyone. Ask for help if you need support or instructions about stopping the use of drugs.  High blood pressure causes heart disease and increases the risk of stroke. Blood pressure should be checked at least every 1 to 2 years. Ongoing high blood pressure should be treated with medicines, if weight loss and exercise are not effective.  If you are 55 to 40  years old, ask your caregiver if you should take aspirin to prevent strokes.  Diabetes screening involves taking a blood sample to check your fasting blood sugar level. This should be done once every 3 years, after age 4, if you are within normal weight and without risk factors for diabetes. Testing should be considered at a younger age or be carried out more frequently if you are overweight and have  at least 1 risk factor for diabetes.  Breast cancer screening is essential preventative care for women. You should practice "breast self-awareness." This means understanding the normal appearance and feel of your breasts and may include breast self-examination. Any changes detected, no matter how small, should be reported to a caregiver. Women in their 14s and 30s should have a clinical breast exam (CBE) by a caregiver as part of a regular health exam every 1 to 3 years. After age 64, women should have a CBE every year. Starting at age 96, women should consider having a mammogram (breast X-ray) every year. Women who have a family history of breast cancer should talk to their caregiver about genetic screening. Women at a high risk of breast cancer should talk to their caregiver about having an MRI and a mammogram every year.  The Pap test is a screening test for cervical cancer. Women should have a Pap test starting at age 52. Between ages 35 and 51, Pap tests should be repeated every 2 years. Beginning at age 25, you should have a Pap test every 3 years as long as the past 3 Pap tests have been normal. If you had a hysterectomy for a problem that was not cancer or a condition that could lead to cancer, then you no longer need Pap tests. If you are between ages 70 and 87, and you have had normal Pap tests going back 10 years, you no longer need Pap tests. If you have had past treatment for cervical cancer or a condition that could lead to cancer, you need Pap tests and screening for cancer for at least 20 years after your treatment. If Pap tests have been discontinued, risk factors (such as a new sexual partner) need to be reassessed to determine if screening should be resumed. Some women have medical problems that increase the chance of getting cervical cancer. In these cases, your caregiver may recommend more frequent screening and Pap tests.  The human papillomavirus (HPV) test is an additional test that may  be used for cervical cancer screening. The HPV test looks for the virus that can cause the cell changes on the cervix. The cells collected during the Pap test can be tested for HPV. The HPV test could be used to screen women aged 53 years and older, and should be used in women of any age who have unclear Pap test results. After the age of 108, women should have HPV testing at the same frequency as a Pap test.  Colorectal cancer can be detected and often prevented. Most routine colorectal cancer screening begins at the age of 51 and continues through age 62. However, your caregiver may recommend screening at an earlier age if you have risk factors for colon cancer. On a yearly basis, your caregiver may provide home test kits to check for hidden blood in the stool. Use of a small camera at the end of a tube, to directly examine the colon (sigmoidoscopy or colonoscopy), can detect the earliest forms of colorectal cancer. Talk to your caregiver about this at age 25,  when routine screening begins. Direct examination of the colon should be repeated every 5 to 10 years through age 71, unless early forms of pre-cancerous polyps or small growths are found.  Hepatitis C blood testing is recommended for all people born from [birth year] through 1963 and any individual with known risks for hepatitis C.  Practice safe sex. Use condoms and avoid high-risk sexual practices to reduce the spread of sexually transmitted infections (STIs). Sexually active women aged 80 and younger should be checked for Chlamydia, which is a common sexually transmitted infection. Older women with new or multiple partners should also be tested for Chlamydia. Testing for other STIs is recommended if you are sexually active and at increased risk.  Osteoporosis is a disease in which the bones lose minerals and strength with aging. This can result in serious bone fractures. The risk of osteoporosis can be identified using a bone density scan. Women ages 55  and over and women at risk for fractures or osteoporosis should discuss screening with their caregivers. Ask your caregiver whether you should be taking a calcium supplement or vitamin D to reduce the rate of osteoporosis.  Menopause can be associated with physical symptoms and risks. Hormone replacement therapy is available to decrease symptoms and risks. You should talk to your caregiver about whether hormone replacement therapy is right for you.  Use sunscreen with a sun protection factor (SPF) of 30 or greater. Apply sunscreen liberally and repeatedly throughout the day. You should seek shade when your shadow is shorter than you. Protect yourself by wearing long sleeves, pants, a wide-brimmed hat, and sunglasses year round, whenever you are outdoors.  Notify your caregiver of new moles or changes in moles, especially if there is a change in shape or color. Also notify your caregiver if a mole is larger than the size of a pencil eraser.  Stay current with your immunizations. Document Released: 07/19/2010 Document Revised: 03/28/2011 Document Reviewed: 07/19/2010 [facility] Patient Information 2014 [place], [place].

## 2012-10-12 NOTE — Progress Notes (Signed)
Subjective:    Patient ID: Joyce Russell, female    DOB: 08/22/1970, 42 y.o.   MRN: [id number]  HPI  New patient to me, here to establish with PCP patient is here today for annual physical. Patient feels well overall  Past Medical History  Diagnosis Date  . Ectopic pregnancy 2012    right  . Chronic tension headache    Family History  Problem Relation Age of Onset  . Hypertension Mother   . Diabetes Father   . Diabetes Maternal Grandfather   . Hypertension Maternal Grandfather   . Stroke Maternal Grandfather 79  . Cancer Paternal Grandmother     uterine  . Heart disease Sister     tachycardia  . Diabetes Paternal Aunt   . Hypertension Sister    History  Substance Use Topics  . Smoking status: Never Smoker   . Smokeless tobacco: Never Used  . Alcohol Use: No    Review of Systems Constitutional: Negative for fever or weight change.  Respiratory: Negative for cough and shortness of breath.   Cardiovascular: Negative for chest pain or palpitations.  Gastrointestinal: Negative for abdominal pain, no bowel changes.  Musculoskeletal: Negative for gait problem or joint swelling.  Skin: Negative for rash.  Neurological: Negative for dizziness or headache.  No other specific complaints in a complete review of systems (except as listed in HPI above).     Objective:   Physical Exam BP 122/90  Pulse 68  Temp(Src) 97.2 F (36.2 C) (Oral)  Ht 5\' 4"  (1.626 m)  Wt 182 lb 6.4 oz (82.736 kg)  BMI 31.29 kg/m2  SpO2 98% Wt Readings from Last 3 Encounters:  10/12/12 182 lb 6.4 oz (82.736 kg)  08/03/11 174 lb (78.926 kg)  07/20/11 171 lb (77.565 kg)   Constitutional: She is overweight, but appears well-developed and well-nourished. No distress.  HENT: Head: Normocephalic and atraumatic. Ears: B TMs ok, no erythema or effusion; Nose: Nose normal. Mouth/Throat: Oropharynx is clear and moist. No oropharyngeal exudate.  Eyes: Conjunctivae and EOM are normal. Pupils are equal,  round, and reactive to light. No scleral icterus.  Neck: Normal range of motion. Neck supple. No JVD present. No thyromegaly present.  Cardiovascular: Normal rate, regular rhythm and normal heart sounds.  No murmur heard. No BLE edema. Pulmonary/Chest: Effort normal and breath sounds normal. No respiratory distress. She has no wheezes.  Abdominal: Soft. Bowel sounds are normal. She exhibits no distension. There is no tenderness. no masses Musculoskeletal: Normal range of motion, no joint effusions. No gross deformities Neurological: She is alert and oriented to person, place, and time. No cranial nerve deficit. Coordination, balance, strength, speech and gait are normal.  Skin: Skin is warm and dry. No rash noted. No erythema.  Psychiatric: She has a normal mood and affect. Her behavior is normal. Judgment and thought content normal.   Lab Results  Component Value Date   WBC 8.7 05/01/2011   HGB 9.4* 05/01/2011   HCT 29.1* 05/01/2011   PLT 216 05/01/2011   GLUCOSE 108* 04/29/2011   ALT 17 04/29/2011   AST 24 04/29/2011   NA 135 04/29/2011   K 3.7 04/29/2011   CL 103 04/29/2011   CREATININE 0.47* 04/29/2011   BUN 4* 04/29/2011   CO2 20 04/29/2011       Assessment & Plan:   CPX/v70.0 - Patient has been counseled on age-appropriate routine health concerns for screening and prevention. These are reviewed and up-to-date. Immunizations are up-to-date or declined. Labs  ordered and reviewed.

## 2013-04-15 ENCOUNTER — Encounter: Payer: Self-pay | Admitting: Internal Medicine

## 2013-04-15 ENCOUNTER — Other Ambulatory Visit: Payer: Self-pay | Admitting: Internal Medicine

## 2013-04-16 MED ORDER — ISOMETHEPTENE-DICHLORAL-APAP 65-100-325 MG PO CAPS: 1.0000 | ORAL_CAPSULE | Freq: Four times a day (QID) | ORAL | Status: DC | PRN

## 2013-04-16 NOTE — Telephone Encounter (Signed)
Faxed script to [place].../lmb 

## 2013-08-11 ENCOUNTER — Other Ambulatory Visit: Payer: Self-pay | Admitting: Internal Medicine

## 2013-08-12 MED ORDER — ISOMETHEPTENE-DICHLORAL-APAP 65-100-325 MG PO CAPS: 1.0000 | ORAL_CAPSULE | Freq: Four times a day (QID) | ORAL | Status: DC | PRN

## 2013-08-12 NOTE — Telephone Encounter (Signed)
Patient has scheduled a med fu for 9/24 and a cpe for 1/4.  She has 2 more midrin pills left.  She is requesting a script to get her through until her follow up on 09/24.  She can be reached at [phone]

## 2013-08-12 NOTE — Telephone Encounter (Signed)
Called pt to inform that rx has been given to MD and MD will review the request in the morning.

## 2013-08-15 NOTE — Telephone Encounter (Signed)
Left a msg on triage stating she never heard back from office concerning her medication. She is needing meds sent mychart msg needing midrin. Called pt back inform her I called [facility] to see if they received fax med is ready and waiting for pick-up...Johny Chess

## 2013-10-10 ENCOUNTER — Ambulatory Visit: Payer: 59 | Admitting: Internal Medicine

## 2013-11-01 ENCOUNTER — Other Ambulatory Visit: Payer: Self-pay

## 2013-11-18 ENCOUNTER — Encounter: Payer: Self-pay | Admitting: Internal Medicine

## 2014-01-20 ENCOUNTER — Ambulatory Visit (INDEPENDENT_AMBULATORY_CARE_PROVIDER_SITE_OTHER): Payer: 59 | Admitting: Internal Medicine

## 2014-01-20 ENCOUNTER — Other Ambulatory Visit (INDEPENDENT_AMBULATORY_CARE_PROVIDER_SITE_OTHER): Payer: 59

## 2014-01-20 ENCOUNTER — Encounter: Payer: Self-pay | Admitting: Internal Medicine

## 2014-01-20 VITALS — BP 136/84 | HR 80 | Temp 98.0°F | Resp 16 | Ht 64.0 in | Wt 167.0 lb

## 2014-01-20 DIAGNOSIS — Z1239 Encounter for other screening for malignant neoplasm of breast: Secondary | ICD-10-CM

## 2014-01-20 DIAGNOSIS — Z0000 Encounter for general adult medical examination without abnormal findings: Secondary | ICD-10-CM

## 2014-01-20 LAB — LIPID PANEL
CHOL/HDL RATIO: 5
Cholesterol: 226 mg/dL — ABNORMAL HIGH (ref 0–200)
HDL: 41.8 mg/dL (ref >39.00)
LDL CALC: 172 mg/dL — AB (ref 0–99)
NonHDL: 184.2
Triglycerides: 61 mg/dL (ref 0.0–149.0)
VLDL: 12.2 mg/dL (ref 0.0–40.0)

## 2014-01-20 LAB — URINALYSIS, ROUTINE W REFLEX MICROSCOPIC
Bilirubin Urine: NEGATIVE
Ketones, ur: NEGATIVE
LEUKOCYTES UA: NEGATIVE
NITRITE: NEGATIVE
PH: 6 (ref 5.0–8.0)
Specific Gravity, Urine: 1.025 (ref 1.000–1.030)
Total Protein, Urine: NEGATIVE
Urine Glucose: NEGATIVE
Urobilinogen, UA: 0.2 (ref 0.0–1.0)

## 2014-01-20 LAB — CBC WITH DIFFERENTIAL/PLATELET
BASOS ABS: 0 10*3/uL (ref 0.0–0.1)
Basophils Relative: 0.6 % (ref 0.0–3.0)
EOS PCT: 1.6 % (ref 0.0–5.0)
Eosinophils Absolute: 0.1 10*3/uL (ref 0.0–0.7)
HEMATOCRIT: 37.4 % (ref 36.0–46.0)
Hemoglobin: 12.2 g/dL (ref 12.0–15.0)
LYMPHS PCT: 30.5 % (ref 12.0–46.0)
Lymphs Abs: 1.6 10*3/uL (ref 0.7–4.0)
MCHC: 32.7 g/dL (ref 30.0–36.0)
MCV: 75.7 fl — ABNORMAL LOW (ref 78.0–100.0)
MONOS PCT: 9.4 % (ref 3.0–12.0)
Monocytes Absolute: 0.5 10*3/uL (ref 0.1–1.0)
NEUTROS PCT: 57.9 % (ref 43.0–77.0)
Neutro Abs: 3 10*3/uL (ref 1.4–7.7)
PLATELETS: 287 10*3/uL (ref 150.0–400.0)
RBC: 4.94 Mil/uL (ref 3.87–5.11)
RDW: 17.3 % — ABNORMAL HIGH (ref 11.5–15.5)
WBC: 5.1 10*3/uL (ref 4.0–10.5)

## 2014-01-20 LAB — BASIC METABOLIC PANEL
BUN: 12 mg/dL (ref 6–23)
CHLORIDE: 103 meq/L (ref 96–112)
CO2: 25 mEq/L (ref 19–32)
Calcium: 9 mg/dL (ref 8.4–10.5)
Creatinine, Ser: 0.5 mg/dL (ref 0.4–1.2)
GFR: 159.33 mL/min (ref >60.00)
GLUCOSE: 82 mg/dL (ref 70–99)
POTASSIUM: 4.4 meq/L (ref 3.5–5.1)
Sodium: 137 mEq/L (ref 135–145)

## 2014-01-20 LAB — HEPATIC FUNCTION PANEL
ALT: 47 U/L — AB (ref 0–35)
AST: 30 U/L (ref 0–37)
Albumin: 3.9 g/dL (ref 3.5–5.2)
Alkaline Phosphatase: 45 U/L (ref 39–117)
BILIRUBIN DIRECT: 0 mg/dL (ref 0.0–0.3)
TOTAL PROTEIN: 7.9 g/dL (ref 6.0–8.3)
Total Bilirubin: 0.3 mg/dL (ref 0.2–1.2)

## 2014-01-20 LAB — TSH: TSH: 1.61 u[IU]/mL (ref 0.35–4.50)

## 2014-01-20 NOTE — Patient Instructions (Addendum)
It was good to see you today.  We have reviewed your prior records including labs and tests today  Health Maintenance reviewed - all recommended immunizations and age-appropriate screenings are up-to-date.  Test(s) ordered today. Your results will be released to [facility] (or called to you) after review, usually within 72hours after test completion. If any changes need to be made, you will be notified at that same time.  Medications reviewed and updated, no changes recommended at this time.  Please schedule followup in 12 months for annual exam and labs, call sooner if problems.  Health Maintenance Adopting a healthy lifestyle and getting preventive care can go a long way to promote health and wellness. Talk with your health care provider about what schedule of regular examinations is right for you. This is a good chance for you to check in with your provider about disease prevention and staying healthy. In between checkups, there are plenty of things you can do on your own. Experts have done a lot of research about which lifestyle changes and preventive measures are most likely to keep you healthy. Ask your health care provider for more information. WEIGHT AND DIET  Eat a healthy diet  Be sure to include plenty of vegetables, fruits, low-fat dairy products, and lean protein.  Do not eat a lot of foods high in solid fats, added sugars, or salt.  Get regular exercise. This is one of the most important things you can do for your health.  Most adults should exercise for at least 150 minutes each week. The exercise should increase your heart rate and make you sweat (moderate-intensity exercise).  Most adults should also do strengthening exercises at least twice a week. This is in addition to the moderate-intensity exercise.  Maintain a healthy weight  Body mass index (BMI) is a measurement that can be used to identify possible weight problems. It estimates body fat based on height and weight.  Your health care provider can help determine your BMI and help you achieve or maintain a healthy weight.  For females 80 years of age and older:   A BMI below 18.5 is considered underweight.  A BMI of 18.5 to 24.9 is normal.  A BMI of 25 to 29.9 is considered overweight.  A BMI of 30 and above is considered obese.  Watch levels of cholesterol and blood lipids  You should start having your blood tested for lipids and cholesterol at 42 years of age, then have this test every 5 years.  You may need to have your cholesterol levels checked more often if:  Your lipid or cholesterol levels are high.  You are older than 42 years of age.  You are at high risk for heart disease.  CANCER SCREENING   Lung Cancer  Lung cancer screening is recommended for adults 73-12 years old who are at high risk for lung cancer because of a history of smoking.  A yearly low-dose CT scan of the lungs is recommended for people who:  Currently smoke.  Have quit within the past 15 years.  Have at least a 30-pack-year history of smoking. A pack year is smoking an average of one pack of cigarettes a day for 1 year.  Yearly screening should continue until it has been 15 years since you quit.  Yearly screening should stop if you develop a health problem that would prevent you from having lung cancer treatment.  Breast Cancer  Practice breast self-awareness. This means understanding how your breasts normally appear and  feel.  It also means doing regular breast self-exams. Let your health care provider know about any changes, no matter how small.  If you are in your 20s or 30s, you should have a clinical breast exam (CBE) by a health care provider every 1-3 years as part of a regular health exam.  If you are 56 or older, have a CBE every year. Also consider having a breast X-ray (mammogram) every year.  If you have a family history of breast cancer, talk to your health care provider about genetic  screening.  If you are at high risk for breast cancer, talk to your health care provider about having an MRI and a mammogram every year.  Breast cancer gene (BRCA) assessment is recommended for women who have family members with BRCA-related cancers. BRCA-related cancers include:  Breast.  Ovarian.  Tubal.  Peritoneal cancers.  Results of the assessment will determine the need for genetic counseling and BRCA1 and BRCA2 testing. Cervical Cancer Routine pelvic examinations to screen for cervical cancer are no longer recommended for nonpregnant women who are considered low risk for cancer of the pelvic organs (ovaries, uterus, and vagina) and who do not have symptoms. A pelvic examination may be necessary if you have symptoms including those associated with pelvic infections. Ask your health care provider if a screening pelvic exam is right for you.   The Pap test is the screening test for cervical cancer for women who are considered at risk.  If you had a hysterectomy for a problem that was not cancer or a condition that could lead to cancer, then you no longer need Pap tests.  If you are older than 65 years, and you have had normal Pap tests for the past 10 years, you no longer need to have Pap tests.  If you have had past treatment for cervical cancer or a condition that could lead to cancer, you need Pap tests and screening for cancer for at least 20 years after your treatment.  If you no longer get a Pap test, assess your risk factors if they change (such as having a new sexual partner). This can affect whether you should start being screened again.  Some women have medical problems that increase their chance of getting cervical cancer. If this is the case for you, your health care provider may recommend more frequent screening and Pap tests.  The human papillomavirus (HPV) test is another test that may be used for cervical cancer screening. The HPV test looks for the virus that can  cause cell changes in the cervix. The cells collected during the Pap test can be tested for HPV.  The HPV test can be used to screen women 61 years of age and older. Getting tested for HPV can extend the interval between normal Pap tests from three to five years.  An HPV test also should be used to screen women of any age who have unclear Pap test results.  After 42 years of age, women should have HPV testing as often as Pap tests.  Colorectal Cancer  This type of cancer can be detected and often prevented.  Routine colorectal cancer screening usually begins at 42 years of age and continues through 42 years of age.  Your health care provider may recommend screening at an earlier age if you have risk factors for colon cancer.  Your health care provider may also recommend using home test kits to check for hidden blood in the stool.  A small camera  at the end of a tube can be used to examine your colon directly (sigmoidoscopy or colonoscopy). This is done to check for the earliest forms of colorectal cancer.  Routine screening usually begins at age 50.  Direct examination of the colon should be repeated every 5-10 years through 42 years of age. However, you may need to be screened more often if early forms of precancerous polyps or small growths are found. Skin Cancer  Check your skin from head to toe regularly.  Tell your health care provider about any new moles or changes in moles, especially if there is a change in a mole's shape or color.  Also tell your health care provider if you have a mole that is larger than the size of a pencil eraser.  Always use sunscreen. Apply sunscreen liberally and repeatedly throughout the day.  Protect yourself by wearing long sleeves, pants, a wide-brimmed hat, and sunglasses whenever you are outside. HEART DISEASE, DIABETES, AND HIGH BLOOD PRESSURE   Have your blood pressure checked at least every 1-2 years. High blood pressure causes heart  disease and increases the risk of stroke.  If you are between 55 years and 79 years old, ask your health care provider if you should take aspirin to prevent strokes.  Have regular diabetes screenings. This involves taking a blood sample to check your fasting blood sugar level.  If you are at a normal weight and have a low risk for diabetes, have this test once every three years after 42 years of age.  If you are overweight and have a high risk for diabetes, consider being tested at a younger age or more often. PREVENTING INFECTION  Hepatitis B  If you have a higher risk for hepatitis B, you should be screened for this virus. You are considered at high risk for hepatitis B if:  You were born in a country where hepatitis B is common. Ask your health care provider which countries are considered high risk.  Your parents were born in a high-risk country, and you have not been immunized against hepatitis B (hepatitis B vaccine).  You have HIV or AIDS.  You use needles to inject street drugs.  You live with someone who has hepatitis B.  You have had sex with someone who has hepatitis B.  You get hemodialysis treatment.  You take certain medicines for conditions, including cancer, organ transplantation, and autoimmune conditions. Hepatitis C  Blood testing is recommended for:  Everyone born from 1945 through 1965.  Anyone with known risk factors for hepatitis C. Sexually transmitted infections (STIs)  You should be screened for sexually transmitted infections (STIs) including gonorrhea and chlamydia if:  You are sexually active and are younger than 42 years of age.  You are older than 42 years of age and your health care provider tells you that you are at risk for this type of infection.  Your sexual activity has changed since you were last screened and you are at an increased risk for chlamydia or gonorrhea. Ask your health care provider if you are at risk.  If you do not have  HIV, but are at risk, it may be recommended that you take a prescription medicine daily to prevent HIV infection. This is called pre-exposure prophylaxis (PrEP). You are considered at risk if:  You are sexually active and do not regularly use condoms or know the HIV status of your partner(s).  You take drugs by injection.  You are sexually active with a partner   who has HIV. Talk with your health care provider about whether you are at high risk of being infected with HIV. If you choose to begin PrEP, you should first be tested for HIV. You should then be tested every 3 months for as long as you are taking PrEP.  PREGNANCY   If you are premenopausal and you may become pregnant, ask your health care provider about preconception counseling.  If you may become pregnant, take 400 to 800 micrograms (mcg) of folic acid every day.  If you want to prevent pregnancy, talk to your health care provider about birth control (contraception). OSTEOPOROSIS AND MENOPAUSE   Osteoporosis is a disease in which the bones lose minerals and strength with aging. This can result in serious bone fractures. Your risk for osteoporosis can be identified using a bone density scan.  If you are 65 years of age or older, or if you are at risk for osteoporosis and fractures, ask your health care provider if you should be screened.  Ask your health care provider whether you should take a calcium or vitamin D supplement to lower your risk for osteoporosis.  Menopause may have certain physical symptoms and risks.  Hormone replacement therapy may reduce some of these symptoms and risks. Talk to your health care provider about whether hormone replacement therapy is right for you.  HOME CARE INSTRUCTIONS   Schedule regular health, dental, and eye exams.  Stay current with your immunizations.   Do not use any tobacco products including cigarettes, chewing tobacco, or electronic cigarettes.  If you are pregnant, do not  drink alcohol.  If you are breastfeeding, limit how much and how often you drink alcohol.  Limit alcohol intake to no more than 1 drink per day for nonpregnant women. One drink equals 12 ounces of beer, 5 ounces of wine, or 1 ounces of hard liquor.  Do not use street drugs.  Do not share needles.  Ask your health care provider for help if you need support or information about quitting drugs.  Tell your health care provider if you often feel depressed.  Tell your health care provider if you have ever been abused or do not feel safe at home. Document Released: 07/19/2010 Document Revised: 05/20/2013 Document Reviewed: 12/05/2012 [employer] Patient Information 2015 [employer]. This information is not intended to replace advice given to you by your health care provider. Make sure you discuss any questions you have with your health care provider.  

## 2014-01-20 NOTE — Progress Notes (Signed)
Subjective:    Patient ID: Joyce Russell, female    DOB: 1970-11-21, 44 y.o.   MRN: [id number]  HPI  patient is here today for annual physical. Patient feels well and has no complaints.  Also reviewed chronic medical issues and interval medical events  Past Medical History  Diagnosis Date  . Ectopic pregnancy 2012    right  . Chronic tension headache    Family History  Problem Relation Age of Onset  . Hypertension Mother   . Diabetes Father   . Diabetes Maternal Grandfather   . Hypertension Maternal Grandfather   . Stroke Maternal Grandfather 8  . Cancer Paternal Grandmother     uterine  . Heart disease Sister     tachycardia  . Diabetes Paternal Aunt   . Hypertension Sister    History  Substance Use Topics  . Smoking status: Never Smoker   . Smokeless tobacco: Never Used  . Alcohol Use: No    Review of Systems  Constitutional: Negative for fatigue and unexpected weight change.  Respiratory: Negative for cough, shortness of breath and wheezing.   Cardiovascular: Negative for chest pain, palpitations and leg swelling.  Gastrointestinal: Negative for nausea, abdominal pain and diarrhea.  Neurological: Negative for dizziness, weakness, light-headedness and headaches.  Psychiatric/Behavioral: Negative for dysphoric mood. The patient is not nervous/anxious.   All other systems reviewed and are negative.      Objective:   Physical Exam  BP 136/84 mmHg  Pulse 80  Temp(Src) 98 F (36.7 C) (Oral)  Resp 16  Ht 5\' 4"  (1.626 m)  Wt 167 lb (75.751 kg)  BMI 28.65 kg/m2  SpO2 98%  LMP  (Within Days) Wt Readings from Last 3 Encounters:  01/20/14 167 lb (75.751 kg)  10/12/12 182 lb 6.4 oz (82.736 kg)  08/03/11 174 lb (78.926 kg)   Constitutional: She appears well-developed and well-nourished. No distress.  HENT: Head: Normocephalic and atraumatic. Ears: B TMs ok, no erythema or effusion; Nose: Nose normal. Mouth/Throat: Oropharynx is clear and moist. No  oropharyngeal exudate.  Eyes: Conjunctivae and EOM are normal. Pupils are equal, round, and reactive to light. No scleral icterus.  Neck: Normal range of motion. Neck supple. No JVD present. No thyromegaly present.  Cardiovascular: Normal rate, regular rhythm and normal heart sounds.  No murmur heard. No BLE edema. Pulmonary/Chest: Effort normal and breath sounds normal. No respiratory distress. She has no wheezes.  Abdominal: Soft. Bowel sounds are normal. She exhibits no distension. There is no tenderness. no masses GU/breast: defer to gyn Musculoskeletal: Normal range of motion, no joint effusions. No gross deformities Neurological: She is alert and oriented to person, place, and time. No cranial nerve deficit. Coordination, balance, strength, speech and gait are normal.  Skin: Skin is warm and dry. No rash noted. No erythema.  Psychiatric: She has a normal mood and affect. Her behavior is normal. Judgment and thought content normal.    Lab Results  Component Value Date   WBC 4.2* 10/12/2012   HGB 12.5 10/12/2012   HCT 37.4 10/12/2012   PLT 245.0 10/12/2012   GLUCOSE 86 10/12/2012   CHOL 251* 10/12/2012   TRIG 76.0 10/12/2012   HDL 43.80 10/12/2012   LDLDIRECT 197.5 10/12/2012   ALT 43* 10/12/2012   AST 24 10/12/2012   NA 137 10/12/2012   K 4.1 10/12/2012   CL 105 10/12/2012   CREATININE 0.6 10/12/2012   BUN 14 10/12/2012   CO2 26 10/12/2012   TSH 0.90 10/12/2012  US Fetal Bpp W/o Non Stress  04/29/2011   OBSTETRICAL ULTRASOUND: This exam was performed within a [facility]. The OB US report was generated in the [facility] system, and faxed to the ordering physician.   This report is also available in [employer] and in the [employer]. See [facility] Obstetric US report.      Assessment & Plan:   CPX/z00.00 - Patient has been counseled on age-appropriate routine health concerns for screening and prevention. These are reviewed and up-to-date.  Immunizations are up-to-date or declined. Labs ordered and reviewed.

## 2014-01-20 NOTE — Progress Notes (Signed)
Pre visit review using our clinic review tool, if applicable. No additional management support is needed unless otherwise documented below in the visit note. 

## 2014-01-21 ENCOUNTER — Other Ambulatory Visit: Payer: Self-pay

## 2014-01-21 ENCOUNTER — Other Ambulatory Visit: Payer: Self-pay | Admitting: Internal Medicine

## 2014-01-21 MED ORDER — ISOMETHEPTENE-DICHLORAL-APAP 65-100-325 MG PO CAPS: 1.0000 | ORAL_CAPSULE | Freq: Four times a day (QID) | ORAL | Status: DC | PRN

## 2014-02-24 ENCOUNTER — Ambulatory Visit (HOSPITAL_COMMUNITY)
Admission: RE | Admit: 2014-02-24 | Discharge: 2014-02-24 | Disposition: A | Discharge status: Home / Self Care | Payer: 59 | Source: Ambulatory Visit | Attending: Internal Medicine | Admitting: Internal Medicine

## 2014-02-24 DIAGNOSIS — Z1231 Encounter for screening mammogram for malignant neoplasm of breast: Secondary | ICD-10-CM | POA: Insufficient documentation

## 2014-02-24 DIAGNOSIS — Z1239 Encounter for other screening for malignant neoplasm of breast: Secondary | ICD-10-CM

## 2014-08-01 ENCOUNTER — Other Ambulatory Visit: Payer: Self-pay | Admitting: Internal Medicine

## 2014-08-14 ENCOUNTER — Encounter: Payer: Self-pay | Admitting: Internal Medicine

## 2014-08-14 ENCOUNTER — Ambulatory Visit (INDEPENDENT_AMBULATORY_CARE_PROVIDER_SITE_OTHER): Payer: 59 | Admitting: Internal Medicine

## 2014-08-14 VITALS — BP 156/118 | HR 80 | Temp 98.1°F | Resp 18 | Wt 175.0 lb

## 2014-08-14 DIAGNOSIS — M79644 Pain in right finger(s): Secondary | ICD-10-CM

## 2014-08-14 MED ORDER — DICLOFENAC SODIUM 75 MG PO TBEC: 75.0000 mg | DELAYED_RELEASE_TABLET | Freq: Two times a day (BID) | ORAL | Status: DC

## 2014-08-14 NOTE — Patient Instructions (Signed)
  Soak your  Finger in warm Epsom salt  twice a day for up to 15 minutes. Please report warning signs as we discussed. Worrisome would be red streaks up the extremity, increased pain, fever, or pus production.

## 2014-08-14 NOTE — Progress Notes (Signed)
Pre visit review using our clinic review tool, if applicable. No additional management support is needed unless otherwise documented below in the visit note. 

## 2014-08-14 NOTE — Progress Notes (Signed)
   Subjective:    Patient ID: Joyce Russell, female    DOB: Apr 30, 1970, 44 y.o.   MRN: [id number]  HPI On 08/10/14 she noted some soreness at the DIP joint of the right fourth digit. The soreness and swelling have progressed, most significantly today. It is now tender and tight. There was no injury,vector bite or trigger for this.  She has no constitutional symptoms or other musculoskeletal symptoms.  There is a family history of osteoarthritis in her grandmother but no other collagen vascular issues.   She denies Raynaud's symptoms. She's never smoked.   Review of Systems There is no redness or swelling of other joints. No compromise of range of motion noted. There is no numbness, tingling, or weakness in the extremities. No associated rash or change in  temperature of the skin in the area of the symptoms.    Objective:   Physical Exam  Pertinent or positive findings include: There is visible erythema at the DIP joint and the finger tip of the 4th R digit. There is no increased warmth. In fact all the fingers are cool.ROM is normal but associated with some discomfort.  General appearance :adequately nourished; in no distress.  Eyes: No conjunctival inflammation or scleral icterus is present.  Oral exam:  Lips and gums are healthy appearing.There is no oropharyngeal erythema or exudate noted. Dental hygiene is good.  Heart:  Normal rate and regular rhythm. S1 and S2 normal without gallop, murmur, click, rub or other extra sounds    Lungs:Chest clear to auscultation; no wheezes, rhonchi,rales ,or rubs present.No increased work of breathing.   Vascular : all pulses equal ; no bruits present.  Skin:Warm & dry.  Intact without suspicious lesions or rashes ; no tenting    Lymphatic: No lymphadenopathy is noted about the head, neck, axilla, orepitrochlear areas.   Neuro: Strength, tone normal.         Assessment & Plan:  #1 monarticular swelling and pain  #2 cool extremities  without history of Raynaud's  Plan: See orders and after visit summary.

## 2015-02-03 ENCOUNTER — Other Ambulatory Visit: Payer: Self-pay | Admitting: Internal Medicine

## 2015-02-03 MED FILL — ISOMETHEPT-DICHLORALP-ACETA: 65-100-325 | 7 days supply | Qty: 30 | Fill #0

## 2015-02-03 NOTE — Telephone Encounter (Signed)
Sent 30 day until appt../lmb 

## 2015-02-03 NOTE — Telephone Encounter (Signed)
I have made the appt for 1/30. Can you please fill prescription

## 2015-02-16 ENCOUNTER — Encounter: Payer: Self-pay | Admitting: Internal Medicine

## 2015-02-16 ENCOUNTER — Ambulatory Visit (INDEPENDENT_AMBULATORY_CARE_PROVIDER_SITE_OTHER): Payer: 59 | Admitting: Internal Medicine

## 2015-02-16 ENCOUNTER — Other Ambulatory Visit (INDEPENDENT_AMBULATORY_CARE_PROVIDER_SITE_OTHER): Payer: 59

## 2015-02-16 VITALS — BP 150/100 | HR 71 | Temp 97.7°F | Resp 16 | Ht 64.0 in | Wt 172.0 lb

## 2015-02-16 DIAGNOSIS — I10 Essential (primary) hypertension: Secondary | ICD-10-CM | POA: Insufficient documentation

## 2015-02-16 DIAGNOSIS — Z0001 Encounter for general adult medical examination with abnormal findings: Secondary | ICD-10-CM

## 2015-02-16 DIAGNOSIS — R030 Elevated blood-pressure reading, without diagnosis of hypertension: Secondary | ICD-10-CM | POA: Diagnosis not present

## 2015-02-16 DIAGNOSIS — Z0000 Encounter for general adult medical examination without abnormal findings: Secondary | ICD-10-CM

## 2015-02-16 HISTORY — DX: Essential (primary) hypertension: I10

## 2015-02-16 HISTORY — DX: Encounter for general adult medical examination without abnormal findings: Z00.00

## 2015-02-16 LAB — CBC
HCT: 36.2 % (ref 36.0–46.0)
Hemoglobin: 11.4 g/dL — ABNORMAL LOW (ref 12.0–15.0)
MCHC: 31.4 g/dL (ref 30.0–36.0)
MCV: 70.8 fl — ABNORMAL LOW (ref 78.0–100.0)
PLATELETS: 397 10*3/uL (ref 150.0–400.0)
RBC: 5.11 Mil/uL (ref 3.87–5.11)
RDW: 18.7 % — ABNORMAL HIGH (ref 11.5–15.5)
WBC: 6.4 10*3/uL (ref 4.0–10.5)

## 2015-02-16 LAB — COMPREHENSIVE METABOLIC PANEL
ALK PHOS: 55 U/L (ref 39–117)
ALT: 39 U/L — AB (ref 0–35)
AST: 22 U/L (ref 0–37)
Albumin: 4 g/dL (ref 3.5–5.2)
BILIRUBIN TOTAL: 0.2 mg/dL (ref 0.2–1.2)
BUN: 13 mg/dL (ref 6–23)
CO2: 29 meq/L (ref 19–32)
Calcium: 9.3 mg/dL (ref 8.4–10.5)
Chloride: 99 mEq/L (ref 96–112)
Creatinine, Ser: 0.63 mg/dL (ref 0.40–1.20)
GFR: 132.68 mL/min (ref >60.00)
Glucose, Bld: 81 mg/dL (ref 70–99)
Potassium: 4.2 mEq/L (ref 3.5–5.1)
SODIUM: 134 meq/L — AB (ref 135–145)
Total Protein: 8.3 g/dL (ref 6.0–8.3)

## 2015-02-16 LAB — LIPID PANEL
CHOL/HDL RATIO: 6
Cholesterol: 262 mg/dL — ABNORMAL HIGH (ref 0–200)
HDL: 43.5 mg/dL (ref >39.00)
LDL Cholesterol: 187 mg/dL — ABNORMAL HIGH (ref 0–99)
NONHDL: 218.16
Triglycerides: 156 mg/dL — ABNORMAL HIGH (ref 0.0–149.0)
VLDL: 31.2 mg/dL (ref 0.0–40.0)

## 2015-02-16 NOTE — Progress Notes (Signed)
   Subjective:    Patient ID: Joyce Russell, female    DOB: 1970/09/28, 45 y.o.   MRN: [id number]  HPI The patient is a 45 YO female coming in for wellness. No new concerns but her BP is up slightly which she has been watching. Does not exercise. Eating more salt lately but generally eats healthy.   PMH, [facility], social history reviewed and updated.   Review of Systems  Constitutional: Negative for fever, activity change, appetite change, fatigue and unexpected weight change.  HENT: Negative.   Eyes: Negative.   Respiratory: Negative for cough, chest tightness, shortness of breath and wheezing.   Cardiovascular: Negative for chest pain, palpitations and leg swelling.  Gastrointestinal: Negative for nausea, abdominal pain, diarrhea, constipation and abdominal distention.  Musculoskeletal: Negative.   Skin: Negative.   Neurological: Negative.   Psychiatric/Behavioral: Negative.       Objective:   Physical Exam  Constitutional: She is oriented to person, place, and time. She appears well-developed and well-nourished.  HENT:  Head: Normocephalic and atraumatic.  Eyes: EOM are normal.  Neck: Normal range of motion.  Cardiovascular: Normal rate and regular rhythm.   Pulmonary/Chest: Effort normal and breath sounds normal. No respiratory distress. She has no wheezes. She has no rales.  Abdominal: Soft. Bowel sounds are normal. She exhibits no distension. There is no tenderness. There is no rebound.  Musculoskeletal: She exhibits no edema.  Neurological: She is alert and oriented to person, place, and time. Coordination normal.  Skin: Skin is warm and dry.  Psychiatric: She has a normal mood and affect.   Filed Vitals:   02/16/15 1439 02/16/15 1503  BP: 162/102 150/100  Pulse: 71   Temp: 97.7 F (36.5 C)   TempSrc: Oral   Resp: 16   Height: 5\' 4"  (1.626 m)   Weight: 172 lb (78.019 kg)   SpO2: 98%       Assessment & Plan:

## 2015-02-16 NOTE — Assessment & Plan Note (Signed)
Checking labs today, BP elevated and will monitor. Counseled on exercise. Not due for colonoscopy until 52. Tdap and flu up to date. Given screening recommendations.

## 2015-02-16 NOTE — Progress Notes (Signed)
Pre visit review using our clinic review tool, if applicable. No additional management support is needed unless otherwise documented below in the visit note. 

## 2015-02-16 NOTE — Assessment & Plan Note (Signed)
She will work on exercise and does not want to try medication at this time. Will monitor and she will watch at home.

## 2015-02-16 NOTE — Patient Instructions (Signed)
We will check the labs today and call you back with the results.   Work on getting more exercise to help with the blood pressure.   We will see you back in about 6 months to check on the blood pressure. If it is still high then we may need to start a low dose of a blood pressure medicine.   Exercising to Lose Weight Exercising can help you to lose weight. In order to lose weight through exercise, you need to do vigorous-intensity exercise. You can tell that you are exercising with vigorous intensity if you are breathing very hard and fast and cannot hold a conversation while exercising. Moderate-intensity exercise helps to maintain your current weight. You can tell that you are exercising at a moderate level if you have a higher heart rate and faster breathing, but you are still able to hold a conversation. HOW OFTEN SHOULD I EXERCISE? Choose an activity that you enjoy and set realistic goals. Your health care provider can help you to make an activity plan that works for you. Exercise regularly as directed by your health care provider. This may include:  Doing resistance training twice each week, such as:  Push-ups.  Sit-ups.  Lifting weights.  Using resistance bands.  Doing a given intensity of exercise for a given amount of time. Choose from these options:  150 minutes of moderate-intensity exercise every week.  75 minutes of vigorous-intensity exercise every week.  A mix of moderate-intensity and vigorous-intensity exercise every week. Children, pregnant women, people who are out of shape, people who are overweight, and older adults may need to consult a health care provider for individual recommendations. If you have any sort of medical condition, be sure to consult your health care provider before starting a new exercise program. WHAT ARE SOME ACTIVITIES THAT CAN HELP ME TO LOSE WEIGHT?   Walking at a rate of at least 4.5 miles an hour.  Jogging or running at a rate of 5 miles  per hour.  Biking at a rate of at least 10 miles per hour.  Lap swimming.  Roller-skating or in-line skating.  Cross-country skiing.  Vigorous competitive sports, such as football, basketball, and soccer.  Jumping rope.  Aerobic dancing. HOW CAN I BE MORE ACTIVE IN MY DAY-TO-DAY ACTIVITIES?  Use the stairs instead of the elevator.  Take a walk during your lunch break.  If you drive, park your car farther away from work or school.  If you take public transportation, get off one stop early and walk the rest of the way.  Make all of your phone calls while standing up and walking around.  Get up, stretch, and walk around every 30 minutes throughout the day. WHAT GUIDELINES SHOULD I FOLLOW WHILE EXERCISING?  Do not exercise so much that you hurt yourself, feel dizzy, or get very short of breath.  Consult your health care provider prior to starting a new exercise program.  Wear comfortable clothes and shoes with good support.  Drink plenty of water while you exercise to prevent dehydration or heat stroke. Body water is lost during exercise and must be replaced.  Work out until you breathe faster and your heart beats faster.   This information is not intended to replace advice given to you by your health care provider. Make sure you discuss any questions you have with your health care provider.   Document Released: 02/05/2010 Document Revised: 01/24/2014 Document Reviewed: 06/06/2013 Elsevier Interactive Patient Education [employer].

## 2015-04-08 ENCOUNTER — Other Ambulatory Visit: Payer: Self-pay | Admitting: Internal Medicine

## 2015-04-09 MED FILL — ISOMETHEPT-DICHLORALP-ACETA: 65-100-325 | 10 days supply | Qty: 30 | Fill #0

## 2015-04-09 NOTE — Telephone Encounter (Signed)
Faxed script back to [employer]...lmb 

## 2015-06-05 MED FILL — ISOMETHEPT-DICHLORALP-ACETA: 65-100-325 | 10 days supply | Qty: 30 | Fill #1

## 2015-07-27 ENCOUNTER — Other Ambulatory Visit: Payer: Self-pay | Admitting: Internal Medicine

## 2015-07-27 MED FILL — ISOMETHEPT-DICHLORALP-ACETA: 65-100-325 | 10 days supply | Qty: 30 | Fill #0

## 2015-07-27 NOTE — Telephone Encounter (Signed)
Sent to pharmacy 

## 2015-10-08 ENCOUNTER — Encounter: Payer: Self-pay | Admitting: Student

## 2015-11-13 MED FILL — ISOMETHEPT-DICHLORALP-ACETA: 65-100-325 | 10 days supply | Qty: 30 | Fill #1

## 2016-01-13 ENCOUNTER — Other Ambulatory Visit: Payer: Self-pay | Admitting: Internal Medicine

## 2016-01-15 NOTE — Telephone Encounter (Signed)
Faxed to pharmacy

## 2016-01-20 MED FILL — ISOMETHEPT-DICHLORALP-ACETA: 65-100-325 | 10 days supply | Qty: 30 | Fill #0

## 2016-05-16 MED FILL — ISOMETHEPT-DICHLORALP-ACETA: 65-100-325 | 10 days supply | Qty: 30 | Fill #1

## 2016-08-01 ENCOUNTER — Telehealth: Payer: Self-pay | Admitting: Internal Medicine

## 2016-08-02 NOTE — Telephone Encounter (Addendum)
Pt needs an office visit before she can get a refill. Has not been seen since 02/16/15.

## 2016-08-05 ENCOUNTER — Other Ambulatory Visit: Payer: Self-pay | Admitting: Internal Medicine

## 2016-08-08 ENCOUNTER — Ambulatory Visit (INDEPENDENT_AMBULATORY_CARE_PROVIDER_SITE_OTHER): Payer: 59 | Admitting: Nurse Practitioner

## 2016-08-08 ENCOUNTER — Encounter: Payer: Self-pay | Admitting: Nurse Practitioner

## 2016-08-08 VITALS — BP 160/104 | HR 80 | Temp 98.2°F | Ht 64.0 in | Wt 185.0 lb

## 2016-08-08 DIAGNOSIS — Z13220 Encounter for screening for lipoid disorders: Secondary | ICD-10-CM | POA: Diagnosis not present

## 2016-08-08 DIAGNOSIS — Z0001 Encounter for general adult medical examination with abnormal findings: Secondary | ICD-10-CM | POA: Diagnosis not present

## 2016-08-08 DIAGNOSIS — Z136 Encounter for screening for cardiovascular disorders: Secondary | ICD-10-CM

## 2016-08-08 DIAGNOSIS — D509 Iron deficiency anemia, unspecified: Secondary | ICD-10-CM

## 2016-08-08 DIAGNOSIS — R030 Elevated blood-pressure reading, without diagnosis of hypertension: Secondary | ICD-10-CM | POA: Diagnosis not present

## 2016-08-08 NOTE — Progress Notes (Signed)
Subjective:    Patient ID: Joyce Russell, female    DOB: 1970/08/26, 46 y.o.   MRN: [id number]  Patient presents today for complete physical  HPI HTN: does not want BP medication at this time. Maintains low sodium diet. Wants to focus on weight loss through regular exercise. BP Readings from Last 3 Encounters:  08/08/16 (!) 160/104  02/16/15 (!) 150/100  08/14/14 (!) 156/118   Immunizations: (TDAP, Hep C screen, Pneumovax, Influenza, zoster)  Health Maintenance  Topic Date Due  . Pap Smear  06/09/2014  . Flu Shot  08/17/2016  . Tetanus Vaccine  04/30/2021  . HIV Screening  Completed   Diet:heart healthy.  Weight:  Wt Readings from Last 3 Encounters:  08/08/16 185 lb (83.9 kg)  02/16/15 172 lb (78 kg)  08/14/14 175 lb (79.4 kg)   Exercise:none  Fall Risk: Fall Risk  08/08/2016  Falls in the past year? No   Home Safety:home with children and husband.  Depression/Suicide: Depression screen [facility] 2/9 08/08/2016  Decreased Interest 0  Down, Depressed, Hopeless 0  PHQ - 2 Score 0   Pap Smear (every 61yrs for >21-29 without HPV, every 71yrs for >30-30yrs with HPV):will schedule with GYN.  Mammogram (yearly, >45yrs):will schedule with GI  Vision: needed.  Dental: up to date.  Advanced Directive: Advanced Directives 04/29/2011  Does Patient Have a Medical Advance Directive? Patient does not have advance directive  Pre-existing out of facility DNR order (yellow form or pink MOST form) No   Sexual History (birth control, marital status, STD):married, sexually active.  Medications and allergies reviewed with patient and updated if appropriate.  Patient Active Problem List   Diagnosis Date Noted  . Elevated blood pressure reading without diagnosis of hypertension 02/16/2015  . Routine general medical examination at a health care facility 02/16/2015  . Tension headache, chronic   . History of ectopic pregnancy 04/29/2011    Current Outpatient Prescriptions on File  Prior to Visit  Medication Sig Dispense Refill  . isometheptene-acetaminophen-dichloralphenazone (MIDRIN) 65-100-325 MG capsule TAKE 1 CAPSULE BY MOUTH 3 TIMES DAILY AS NEEDED FOR MIGRAINE 30 capsule 1   No current facility-administered medications on file prior to visit.     Past Medical History:  Diagnosis Date  . Chronic tension headache   . Ectopic pregnancy 2012   right    Past Surgical History:  Procedure Laterality Date  . SALPINGECTOMY  03/2010   R  . WISDOM TOOTH EXTRACTION      Social History   Social History  . Marital status: Married    Spouse name: N/A  . Number of children: N/A  . Years of education: N/A   Social History Main Topics  . Smoking status: Never Smoker  . Smokeless tobacco: Never Used  . Alcohol use No  . Drug use: No  . Sexual activity: Yes    Birth control/ protection: IUD, Abstinence     Comment: PARAGARD   Other Topics Concern  . None   Social History Narrative   Lives with spouse, 3 children and mother in law   CM @ [facility]    Family History  Problem Relation Age of Onset  . Hypertension Mother   . Glaucoma Mother   . Diabetes Father   . Glaucoma Father   . Diabetes Maternal Grandfather   . Hypertension Maternal Grandfather   . Stroke Maternal Grandfather 66  . Cancer Paternal Grandmother        uterine  . Heart disease  Sister        tachycardia  . Diabetes Paternal Aunt   . Hypertension Sister         Review of Systems  Constitutional: Negative for fever, malaise/fatigue and weight loss.  HENT: Negative for congestion and sore throat.   Eyes:       Negative for visual changes  Respiratory: Negative for cough and shortness of breath.   Cardiovascular: Negative for chest pain, palpitations and leg swelling.  Gastrointestinal: Negative for blood in stool, constipation, diarrhea and heartburn.  Genitourinary: Negative for dysuria, frequency and urgency.  Musculoskeletal: Negative for falls, joint pain and  myalgias.  Skin: Negative for rash.  Neurological: Negative for dizziness, sensory change and headaches.  Endo/Heme/Allergies: Does not bruise/bleed easily.  Psychiatric/Behavioral: Negative for depression, substance abuse and suicidal ideas. The patient is not nervous/anxious.     Objective:   Vitals:   08/08/16 1547  BP: (!) 160/104  Pulse: 80  Temp: 98.2 F (36.8 C)    Body mass index is 31.76 kg/m.   Physical Examination:  Physical Exam  Constitutional: She is oriented to person, place, and time and well-developed, well-nourished, and in no distress. No distress.  HENT:  Right Ear: External ear normal.  Left Ear: External ear normal.  Nose: Nose normal.  Mouth/Throat: Oropharynx is clear and moist. No oropharyngeal exudate.  Eyes: Pupils are equal, round, and reactive to light. Conjunctivae and EOM are normal. No scleral icterus.  Neck: Normal range of motion. Neck supple. No thyromegaly present.  Cardiovascular: Normal rate, normal heart sounds and intact distal pulses.   Pulmonary/Chest: Effort normal and breath sounds normal. She exhibits no tenderness.  Abdominal: Soft. Bowel sounds are normal. She exhibits no distension. There is no tenderness.  Genitourinary:  Genitourinary Comments: Breast and pelvic exam deferred by patient  Musculoskeletal: Normal range of motion. She exhibits no edema or tenderness.  Lymphadenopathy:    She has no cervical adenopathy.  Neurological: She is alert and oriented to person, place, and time. Gait normal.  Skin: Skin is warm and dry.  Psychiatric: Affect and judgment normal.    ASSESSMENT and PLAN:  Loris was seen today for annual exam.  Diagnoses and all orders for this visit:  Encounter for preventative adult health care exam with abnormal findings -     CBC; Future -     Comprehensive metabolic panel; Future -     Lipid panel; Future -     TSH; Future  Elevated blood pressure reading without diagnosis of  hypertension  Encounter for lipid screening for cardiovascular disease -     Lipid panel; Future  Iron deficiency anemia, unspecified iron deficiency anemia type -     ferrous sulfate 325 (65 FE) MG tablet; Take 1 tablet (325 mg total) by mouth 2 (two) times daily with a meal.   No problem-specific Assessment & Plan notes found for this encounter.    Recent Results (from the past 2160 hour(s))  CBC     Status: Abnormal   Collection Time: 08/09/16  9:19 AM  Result Value Ref Range   WBC 6.1 4.0 - 10.5 K/uL   RBC 5.24 (H) 3.87 - 5.11 Mil/uL   Platelets 351.0 150.0 - 400.0 K/uL   Hemoglobin 10.7 (L) 12.0 - 15.0 g/dL   HCT 35.5 (L) 36.0 - 46.0 %   MCV 67.8 Repeated and verified X2. (L) 78.0 - 100.0 fl   MCHC 30.2 30.0 - 36.0 g/dL   RDW 19.4 (H) 11.5 -  15.5 %  Comprehensive metabolic panel     Status: None   Collection Time: 08/09/16  9:19 AM  Result Value Ref Range   Sodium 137 135 - 145 mEq/L   Potassium 4.1 3.5 - 5.1 mEq/L   Chloride 103 96 - 112 mEq/L   CO2 26 19 - 32 mEq/L   Glucose, Bld 98 70 - 99 mg/dL   BUN 10 6 - 23 mg/dL   Creatinine, Ser 0.76 0.40 - 1.20 mg/dL   Total Bilirubin 0.3 0.2 - 1.2 mg/dL   Alkaline Phosphatase 52 39 - 117 U/L   AST 16 0 - 37 U/L   ALT 22 0 - 35 U/L   Total Protein 8.0 6.0 - 8.3 g/dL   Albumin 4.0 3.5 - 5.2 g/dL   Calcium 9.5 8.4 - 10.5 mg/dL   GFR 106.12 >60.00 mL/min  Lipid panel     Status: Abnormal   Collection Time: 08/09/16  9:19 AM  Result Value Ref Range   Cholesterol 251 (H) 0 - 200 mg/dL    Comment: ATP III Classification       Desirable:  < 200 mg/dL               Borderline High:  200 - 239 mg/dL          High:  > = 240 mg/dL   Triglycerides 64.0 0.0 - 149.0 mg/dL    Comment: Normal:  <150 mg/dLBorderline High:  150 - 199 mg/dL   HDL 45.30 >39.00 mg/dL   VLDL 12.8 0.0 - 40.0 mg/dL   LDL Cholesterol 193 (H) 0 - 99 mg/dL   Total CHOL/HDL Ratio 6     Comment:                Men          Women1/2 Average Risk     3.4           3.3Average Risk          5.0          4.42X Average Risk          9.6          7.13X Average Risk          15.0          11.0                       NonHDL 205.95     Comment: NOTE:  Non-HDL goal should be 30 mg/dL higher than patient's LDL goal (i.e. LDL goal of < 70 mg/dL, would have non-HDL goal of < 100 mg/dL)  TSH     Status: None   Collection Time: 08/09/16  9:19 AM  Result Value Ref Range   TSH 1.93 0.35 - 4.50 uIU/mL   Follow up: Return in about 3 months (around 11/08/2016), or if symptoms worsen or fail to improve, for HTN.Wilfred Lacy, NP

## 2016-08-08 NOTE — Patient Instructions (Addendum)
Go to basement for blood draw.  Check BP at home (3times a week) and record  Hypertension Hypertension, commonly called high blood pressure, is when the force of blood pumping through the arteries is too strong. The arteries are the blood vessels that carry blood from the heart throughout the body. Hypertension forces the heart to work harder to pump blood and may cause arteries to become narrow or stiff. Having untreated or uncontrolled hypertension can cause heart attacks, strokes, kidney disease, and other problems. A blood pressure reading consists of a higher number over a lower number. Ideally, your blood pressure should be below 120/80. The first ("top") number is called the systolic pressure. It is a measure of the pressure in your arteries as your heart beats. The second ("bottom") number is called the diastolic pressure. It is a measure of the pressure in your arteries as the heart relaxes. What are the causes? The cause of this condition is not known. What increases the risk? Some risk factors for high blood pressure are under your control. Others are not. Factors you can change  Smoking.  Having type 2 diabetes mellitus, high cholesterol, or both.  Not getting enough exercise or physical activity.  Being overweight.  Having too much fat, sugar, calories, or salt (sodium) in your diet.  Drinking too much alcohol. Factors that are difficult or impossible to change  Having chronic kidney disease.  Having a family history of high blood pressure.  Age. Risk increases with age.  Race. You may be at higher risk if you are African-American.  Gender. Men are at higher risk than women before age 65. After age 7, women are at higher risk than men.  Having obstructive sleep apnea.  Stress. What are the signs or symptoms? Extremely high blood pressure (hypertensive crisis) may cause:  Headache.  Anxiety.  Shortness of breath.  Nosebleed.  Nausea and  vomiting.  Severe chest pain.  Jerky movements you cannot control (seizures).  How is this diagnosed? This condition is diagnosed by measuring your blood pressure while you are seated, with your arm resting on a surface. The cuff of the blood pressure monitor will be placed directly against the skin of your upper arm at the level of your heart. It should be measured at least twice using the same arm. Certain conditions can cause a difference in blood pressure between your right and left arms. Certain factors can cause blood pressure readings to be lower or higher than normal (elevated) for a short period of time:  When your blood pressure is higher when you are in a health care provider's office than when you are at home, this is called white coat hypertension. Most people with this condition do not need medicines.  When your blood pressure is higher at home than when you are in a health care provider's office, this is called masked hypertension. Most people with this condition may need medicines to control blood pressure.  If you have a high blood pressure reading during one visit or you have normal blood pressure with other risk factors:  You may be asked to return on a different day to have your blood pressure checked again.  You may be asked to monitor your blood pressure at home for 1 week or longer.  If you are diagnosed with hypertension, you may have other blood or imaging tests to help your health care provider understand your overall risk for other conditions. How is this treated? This condition is treated by  making healthy lifestyle changes, such as eating healthy foods, exercising more, and reducing your alcohol intake. Your health care provider may prescribe medicine if lifestyle changes are not enough to get your blood pressure under control, and if:  Your systolic blood pressure is above 130.  Your diastolic blood pressure is above 80.  Your personal target blood pressure  may vary depending on your medical conditions, your age, and other factors. Follow these instructions at home: Eating and drinking  Eat a diet that is high in fiber and potassium, and low in sodium, added sugar, and fat. An example eating plan is called the DASH (Dietary Approaches to Stop Hypertension) diet. To eat this way: ? Eat plenty of fresh fruits and vegetables. Try to fill half of your plate at each meal with fruits and vegetables. ? Eat whole grains, such as whole wheat pasta, brown rice, or whole grain bread. Fill about one quarter of your plate with whole grains. ? Eat or drink low-fat dairy products, such as skim milk or low-fat yogurt. ? Avoid fatty cuts of meat, processed or cured meats, and poultry with skin. Fill about one quarter of your plate with lean proteins, such as fish, chicken without skin, beans, eggs, and tofu. ? Avoid premade and processed foods. These tend to be higher in sodium, added sugar, and fat.  Reduce your daily sodium intake. Most people with hypertension should eat less than 1,500 mg of sodium a day.  Limit alcohol intake to no more than 1 drink a day for nonpregnant women and 2 drinks a day for men. One drink equals 12 oz of beer, 5 oz of wine, or 1 oz of hard liquor. Lifestyle  Work with your health care provider to maintain a healthy body weight or to lose weight. Ask what an ideal weight is for you.  Get at least 30 minutes of exercise that causes your heart to beat faster (aerobic exercise) most days of the week. Activities may include walking, swimming, or biking.  Include exercise to strengthen your muscles (resistance exercise), such as pilates or lifting weights, as part of your weekly exercise routine. Try to do these types of exercises for 30 minutes at least 3 days a week.  Do not use any products that contain nicotine or tobacco, such as cigarettes and e-cigarettes. If you need help quitting, ask your health care provider.  Monitor your  blood pressure at home as told by your health care provider.  Keep all follow-up visits as told by your health care provider. This is important. Medicines  Take over-the-counter and prescription medicines only as told by your health care provider. Follow directions carefully. Blood pressure medicines must be taken as prescribed.  Do not skip doses of blood pressure medicine. Doing this puts you at risk for problems and can make the medicine less effective.  Ask your health care provider about side effects or reactions to medicines that you should watch for. Contact a health care provider if:  You think you are having a reaction to a medicine you are taking.  You have headaches that keep coming back (recurring).  You feel dizzy.  You have swelling in your ankles.  You have trouble with your vision. Get help right away if:  You develop a severe headache or confusion.  You have unusual weakness or numbness.  You feel faint.  You have severe pain in your chest or abdomen.  You vomit repeatedly.  You have trouble breathing. Summary  Hypertension is  when the force of blood pumping through your arteries is too strong. If this condition is not controlled, it may put you at risk for serious complications.  Your personal target blood pressure may vary depending on your medical conditions, your age, and other factors. For most people, a normal blood pressure is less than 120/80.  Hypertension is treated with lifestyle changes, medicines, or a combination of both. Lifestyle changes include weight loss, eating a healthy, low-sodium diet, exercising more, and limiting alcohol. This information is not intended to replace advice given to you by your health care provider. Make sure you discuss any questions you have with your health care provider. Document Released: 01/03/2005 Document Revised: 12/02/2015 Document Reviewed: 12/02/2015 Elsevier Interactive Patient Education  [employer].

## 2016-08-09 ENCOUNTER — Other Ambulatory Visit (INDEPENDENT_AMBULATORY_CARE_PROVIDER_SITE_OTHER): Payer: 59

## 2016-08-09 DIAGNOSIS — Z136 Encounter for screening for cardiovascular disorders: Secondary | ICD-10-CM | POA: Diagnosis not present

## 2016-08-09 DIAGNOSIS — Z0000 Encounter for general adult medical examination without abnormal findings: Secondary | ICD-10-CM

## 2016-08-09 DIAGNOSIS — G44229 Chronic tension-type headache, not intractable: Secondary | ICD-10-CM

## 2016-08-09 DIAGNOSIS — Z13220 Encounter for screening for lipoid disorders: Secondary | ICD-10-CM | POA: Diagnosis not present

## 2016-08-09 LAB — COMPREHENSIVE METABOLIC PANEL
ALT: 22 U/L (ref 0–35)
AST: 16 U/L (ref 0–37)
Albumin: 4 g/dL (ref 3.5–5.2)
Alkaline Phosphatase: 52 U/L (ref 39–117)
BUN: 10 mg/dL (ref 6–23)
CHLORIDE: 103 meq/L (ref 96–112)
CO2: 26 mEq/L (ref 19–32)
CREATININE: 0.76 mg/dL (ref 0.40–1.20)
Calcium: 9.5 mg/dL (ref 8.4–10.5)
GFR: 106.12 mL/min (ref >60.00)
GLUCOSE: 98 mg/dL (ref 70–99)
POTASSIUM: 4.1 meq/L (ref 3.5–5.1)
SODIUM: 137 meq/L (ref 135–145)
Total Bilirubin: 0.3 mg/dL (ref 0.2–1.2)
Total Protein: 8 g/dL (ref 6.0–8.3)

## 2016-08-09 LAB — CBC
HEMATOCRIT: 35.5 % — AB (ref 36.0–46.0)
Hemoglobin: 10.7 g/dL — ABNORMAL LOW (ref 12.0–15.0)
MCHC: 30.2 g/dL (ref 30.0–36.0)
Platelets: 351 10*3/uL (ref 150.0–400.0)
RBC: 5.24 Mil/uL — ABNORMAL HIGH (ref 3.87–5.11)
RDW: 19.4 % — AB (ref 11.5–15.5)
WBC: 6.1 10*3/uL (ref 4.0–10.5)

## 2016-08-09 LAB — LIPID PANEL
CHOL/HDL RATIO: 6
Cholesterol: 251 mg/dL — ABNORMAL HIGH (ref 0–200)
HDL: 45.3 mg/dL (ref >39.00)
LDL CALC: 193 mg/dL — AB (ref 0–99)
NONHDL: 205.95
Triglycerides: 64 mg/dL (ref 0.0–149.0)
VLDL: 12.8 mg/dL (ref 0.0–40.0)

## 2016-08-09 LAB — TSH: TSH: 1.93 u[IU]/mL (ref 0.35–4.50)

## 2016-08-09 MED ORDER — BUTALBITAL-APAP-CAFFEINE 50-325-40 MG PO TABS: 1.0000 | ORAL_TABLET | Freq: Four times a day (QID) | ORAL | 0 refills | Status: DC | PRN

## 2016-08-09 MED ORDER — FERROUS SULFATE 325 (65 FE) MG PO TABS: 325.0000 mg | ORAL_TABLET | Freq: Two times a day (BID) | ORAL | 3 refills | Status: DC

## 2016-08-09 NOTE — Telephone Encounter (Signed)
-----   Message from Shawnie Pons, LPN sent at 02/26/3126  1:39 PM EDT ----- Pt verbalize understand of test result.   Pt was wondering if we can send some in for her migraine medication--on 08/03/2016 it didn't go to [facility] out patient pharmacy. Please advise

## 2016-08-19 MED FILL — BUTALB-ACETAMIN-CAFF 50-325: 50-325-40 | 3 days supply | Qty: 14 | Fill #0

## 2016-08-26 ENCOUNTER — Emergency Department (HOSPITAL_COMMUNITY)
Admission: EM | Admit: 2016-08-26 | Discharge: 2016-08-26 | Disposition: A | Discharge status: Home / Self Care | Payer: 59 | Attending: Emergency Medicine | Admitting: Emergency Medicine

## 2016-08-26 ENCOUNTER — Emergency Department (HOSPITAL_COMMUNITY): Payer: 59

## 2016-08-26 ENCOUNTER — Encounter (HOSPITAL_COMMUNITY): Payer: Self-pay | Admitting: Emergency Medicine

## 2016-08-26 DIAGNOSIS — Z79899 Other long term (current) drug therapy: Secondary | ICD-10-CM | POA: Insufficient documentation

## 2016-08-26 DIAGNOSIS — R002 Palpitations: Secondary | ICD-10-CM | POA: Insufficient documentation

## 2016-08-26 DIAGNOSIS — E876 Hypokalemia: Secondary | ICD-10-CM | POA: Insufficient documentation

## 2016-08-26 DIAGNOSIS — R030 Elevated blood-pressure reading, without diagnosis of hypertension: Secondary | ICD-10-CM | POA: Diagnosis not present

## 2016-08-26 LAB — CBC
HCT: 36 % (ref 36.0–46.0)
HEMOGLOBIN: 11.4 g/dL — AB (ref 12.0–15.0)
MCH: 21.2 pg — AB (ref 26.0–34.0)
MCHC: 31.7 g/dL (ref 30.0–36.0)
MCV: 67 fL — AB (ref 78.0–100.0)
PLATELETS: 425 10*3/uL — AB (ref 150–400)
RBC: 5.37 MIL/uL — AB (ref 3.87–5.11)
RDW: 19.1 % — ABNORMAL HIGH (ref 11.5–15.5)
WBC: 9.1 10*3/uL (ref 4.0–10.5)

## 2016-08-26 LAB — URINALYSIS, ROUTINE W REFLEX MICROSCOPIC
Bilirubin Urine: NEGATIVE
Glucose, UA: NEGATIVE mg/dL
Hgb urine dipstick: NEGATIVE
KETONES UR: NEGATIVE mg/dL
LEUKOCYTES UA: NEGATIVE
NITRITE: NEGATIVE
PROTEIN: NEGATIVE mg/dL
Specific Gravity, Urine: 1.004 — ABNORMAL LOW (ref 1.005–1.030)
pH: 7 (ref 5.0–8.0)

## 2016-08-26 LAB — BASIC METABOLIC PANEL
Anion gap: 13 (ref 5–15)
BUN: 14 mg/dL (ref 6–20)
CHLORIDE: 101 mmol/L (ref 101–111)
CO2: 21 mmol/L — AB (ref 22–32)
CREATININE: 0.8 mg/dL (ref 0.44–1.00)
Calcium: 9 mg/dL (ref 8.9–10.3)
GFR calc non Af Amer: >60 mL/min (ref >60)
GLUCOSE: 129 mg/dL — AB (ref 65–99)
Potassium: 2.9 mmol/L — ABNORMAL LOW (ref 3.5–5.1)
Sodium: 135 mmol/L (ref 135–145)

## 2016-08-26 LAB — I-STAT TROPONIN, ED
TROPONIN I, POC: 0 ng/mL (ref 0.00–0.08)
Troponin i, poc: 0.01 ng/mL (ref 0.00–0.08)

## 2016-08-26 LAB — MAGNESIUM: MAGNESIUM: 2 mg/dL (ref 1.7–2.4)

## 2016-08-26 LAB — T4, FREE: FREE T4: 0.86 ng/dL (ref 0.61–1.12)

## 2016-08-26 LAB — D-DIMER, QUANTITATIVE: D-Dimer, Quant: 0.42 ug/mL-FEU (ref 0.00–0.50)

## 2016-08-26 LAB — PREGNANCY, URINE: PREG TEST UR: NEGATIVE

## 2016-08-26 LAB — TSH: TSH: 1.289 u[IU]/mL (ref 0.350–4.500)

## 2016-08-26 MED ORDER — POTASSIUM CHLORIDE CRYS ER 20 MEQ PO TBCR
40.0000 meq | EXTENDED_RELEASE_TABLET | Freq: Once | ORAL | Status: AC
  Administered 2016-08-26: 40 meq via ORAL
  Filled 2016-08-26: qty 2

## 2016-08-26 MED ORDER — POTASSIUM CHLORIDE CRYS ER 20 MEQ PO TBCR: 20.0000 meq | EXTENDED_RELEASE_TABLET | Freq: Every day | ORAL | 0 refills | Status: DC

## 2016-08-26 NOTE — ED Notes (Signed)
Bed: GX41 Expected date:  Expected time:  Means of arrival:  Comments: TR 6

## 2016-08-26 NOTE — ED Triage Notes (Signed)
Pt from work with c/o sudden onset of chest palpitations that began around 1300 after pt drank coffee. Pt does not normally drink coffee. Pt denies CP at present.

## 2016-08-26 NOTE — ED Provider Notes (Signed)
[facility] DEPT Provider Note   CSN: [id number] Arrival date & time: 08/26/16  1306     History   Chief Complaint Chief Complaint  Patient presents with  . Palpitations    HPI Joyce Russell is a 46 y.o. female.  HPI Patient states she drank a cup of coffee this morning and began having palpitations, tingling in the tips of her fingers and some mild dyspnea with exertion that started around 12:30pm, roughly 1 hour after drinking coffee. Patient says she normally drinks tea daily but drinking coffee this morning instead. She denies any recent medication use. Denies any chest pain or pressure. States she is feeling better though she is having episodic "nervous feelings" and palpitations. Denies any recent extended immobilization or travel. No new lower extremity swelling or pain. Past Medical History:  Diagnosis Date  . Chronic tension headache   . Ectopic pregnancy 2012   right    Patient Active Problem List   Diagnosis Date Noted  . Elevated blood pressure reading without diagnosis of hypertension 02/16/2015  . Routine general medical examination at a health care facility 02/16/2015  . Tension headache, chronic   . History of ectopic pregnancy 04/29/2011    Past Surgical History:  Procedure Laterality Date  . SALPINGECTOMY  03/2010   R  . WISDOM TOOTH EXTRACTION      OB History    Gravida Para Term Preterm AB Living   4 3 3  0 1 3   SAB TAB Ectopic Multiple Live Births   0 0 1 0 3       Home Medications    Prior to Admission medications   Medication Sig Start Date End Date Taking? Authorizing Provider  butalbital-acetaminophen-caffeine (FIORICET, ESGIC) 50-325-40 MG tablet Take 1 tablet by mouth every 6 (six) hours as needed for headache. 08/09/16  Yes Nche, Charlene Brooke, NP  ibuprofen (ADVIL,MOTRIN) 200 MG tablet Take 600 mg by mouth every 6 (six) hours as needed for headache.   Yes [provider]  ferrous sulfate 325 (65 FE) MG tablet Take 1  tablet (325 mg total) by mouth 2 (two) times daily with a meal. Patient not taking: Reported on 08/26/2016 08/09/16   Nche, Charlene Brooke, NP  potassium chloride SA (K-DUR,KLOR-CON) 20 MEQ tablet Take 1 tablet (20 mEq total) by mouth daily. 08/26/16   Julianne Rice, MD    Family History Family History  Problem Relation Age of Onset  . Hypertension Mother   . Glaucoma Mother   . Diabetes Father   . Glaucoma Father   . Diabetes Maternal Grandfather   . Hypertension Maternal Grandfather   . Stroke Maternal Grandfather 84  . Cancer Paternal Grandmother        uterine  . Heart disease Sister        tachycardia  . Diabetes Paternal Aunt   . Hypertension Sister     Social History Social History  Substance Use Topics  . Smoking status: Never Smoker  . Smokeless tobacco: Never Used  . Alcohol use No     Allergies   Patient has no known allergies.   Review of Systems Review of Systems  Constitutional: Positive for diaphoresis. Negative for activity change, appetite change, chills, fatigue and fever.  HENT: Negative for sore throat and trouble swallowing.   Eyes: Negative for visual disturbance.  Respiratory: Positive for shortness of breath. Negative for cough and chest tightness.   Cardiovascular: Positive for palpitations. Negative for chest pain and leg swelling.  Gastrointestinal:  Negative for abdominal pain, diarrhea, nausea and vomiting.  Genitourinary: Negative for dysuria, flank pain, frequency and hematuria.  Musculoskeletal: Negative for back pain, myalgias, neck pain and neck stiffness.  Skin: Negative for rash and wound.  Neurological: Positive for dizziness, light-headedness and numbness. Negative for syncope, weakness and headaches.  Psychiatric/Behavioral: Negative for sleep disturbance. The patient is nervous/anxious.   All other systems reviewed and are negative.    Physical Exam Updated Vital Signs BP (!) 154/91   Pulse 78   Temp 98.7 F (37.1 C)  (Oral)   Resp 16   LMP 08/19/2016   SpO2 99%   Physical Exam  Constitutional: She is oriented to person, place, and time. She appears well-developed and well-nourished. No distress.  HENT:  Head: Normocephalic and atraumatic.  Mouth/Throat: Oropharynx is clear and moist. No oropharyngeal exudate.  Eyes: Pupils are equal, round, and reactive to light. EOM are normal.  Neck: Normal range of motion. Neck supple. No thyromegaly present.  Cardiovascular: Regular rhythm.  Exam reveals no gallop and no friction rub.   Murmur heard. Tachycardia  Pulmonary/Chest: Effort normal and breath sounds normal. No respiratory distress. She has no wheezes. She has no rales. She exhibits no tenderness.  Abdominal: Soft. Bowel sounds are normal. There is no tenderness. There is no rebound and no guarding.  Musculoskeletal: Normal range of motion. She exhibits no edema or tenderness.  No lower extremity swelling, asymmetry or tenderness. Distal pulses are 2+.  Lymphadenopathy:    She has no cervical adenopathy.  Neurological: She is alert and oriented to person, place, and time.  Patient is alert and oriented x3 with clear, goal oriented speech. Patient has 5/5 motor in all extremities. Sensation is intact to light touch. No tremor appreciated  Skin: Skin is warm and dry. Capillary refill takes less than 2 seconds. No rash noted. She is not diaphoretic. No erythema.  Psychiatric: She has a normal mood and affect. Her behavior is normal.  Nursing note and vitals reviewed.    ED Treatments / Results  Labs (all labs ordered are listed, but only abnormal results are displayed) Labs Reviewed  BASIC METABOLIC PANEL - Abnormal; Notable for the following:       Result Value   Potassium 2.9 (*)    CO2 21 (*)    Glucose, Bld 129 (*)    All other components within normal limits  CBC - Abnormal; Notable for the following:    RBC 5.37 (*)    Hemoglobin 11.4 (*)    MCV 67.0 (*)    MCH 21.2 (*)    RDW 19.1  (*)    Platelets 425 (*)    All other components within normal limits  URINALYSIS, ROUTINE W REFLEX MICROSCOPIC - Abnormal; Notable for the following:    Color, Urine STRAW (*)    Specific Gravity, Urine 1.004 (*)    All other components within normal limits  D-DIMER, QUANTITATIVE (NOT AT [facility])  TSH  PREGNANCY, URINE  MAGNESIUM  T4, FREE  I-STAT TROPONIN, ED  I-STAT TROPONIN, ED    EKG  EKG Interpretation  Date/Time:  Friday August 26 2016 14:03:30 EDT Ventricular Rate:  102 PR Interval:    QRS Duration: 111 QT Interval:  366 QTC Calculation: 477 R Axis:   36 Text Interpretation:  Sinus tachycardia Minimal ST elevation, inferior leads Baseline wander in lead(s) II aVF Confirmed by Lita Mains  MD, Takeyla Million ([id number]) on 08/26/2016 3:11:31 PM       Radiology Dg Chest  2 View  Result Date: 08/26/2016 CLINICAL DATA:  Sudden onset of chest palpitations EXAM: CHEST  2 VIEW COMPARISON:  None. FINDINGS: Normal heart size and mediastinal contours. No acute infiltrate or edema. No effusion or pneumothorax. No acute osseous findings. IMPRESSION: Negative chest. Electronically Signed   By: Monte Fantasia M.D.   On: 08/26/2016 14:53    Procedures Procedures (including critical care time)  Medications Ordered in ED Medications  potassium chloride SA (K-DUR,KLOR-CON) CR tablet 40 mEq (40 mEq Oral Given 08/26/16 1700)  potassium chloride SA (K-DUR,KLOR-CON) CR tablet 40 mEq (40 mEq Oral Given 08/26/16 1935)     Initial Impression / Assessment and Plan / ED Course  I have reviewed the triage vital signs and the nursing notes.  Pertinent labs & imaging results that were available during my care of the patient were reviewed by me and considered in my medical decision making (see chart for details).    Patient with elevated blood pressure initially and tachycardia. Questionable ST segment depression on initial EKG though wavering baseline. Repeat EKG with no apparent depression. Blood pressure  has improved throughout stay. Troponin 2 is normal. Patient has normal d-dimer. TSH is normal. She was noted to be hypokalemic and started on oral replacement. Symptoms likely due to electrolyte abnormality and caffeine intake. However, she is advised to follow-up closely with her cardiologist. She may need a Holter monitor for further evaluation. She is advised to follow-up with her primary physician regarding her elevated blood pressure and hypokalemia. At no point did patient have any chest pain or chest pressure. She's been given return precautions and voiced understanding.   Final Clinical Impressions(s) / ED Diagnoses   Final diagnoses:  Palpitations  Hypokalemia  Transient elevated blood pressure    New Prescriptions New Prescriptions   POTASSIUM CHLORIDE SA (K-DUR,KLOR-CON) 20 MEQ TABLET    Take 1 tablet (20 mEq total) by mouth daily.     Julianne Rice, MD 08/26/16 2004

## 2016-08-29 ENCOUNTER — Telehealth: Payer: Self-pay | Admitting: Internal Medicine

## 2016-08-29 DIAGNOSIS — R030 Elevated blood-pressure reading, without diagnosis of hypertension: Secondary | ICD-10-CM

## 2016-08-29 MED ORDER — AMLODIPINE BESYLATE 10 MG PO TABS: 5.0000 mg | ORAL_TABLET | Freq: Every day | ORAL | 2 refills | Status: DC

## 2016-08-29 MED FILL — AMLODIPINE BESYLATE 10 MG T: 10 | 90 days supply | Qty: 45 | Fill #0

## 2016-08-29 NOTE — Telephone Encounter (Signed)
Pt called in and stated that she has decided that she needs to go ahead and take the bp meds.  She was in er this past weekend due to her bp.   pharmacy [facility]

## 2016-09-01 ENCOUNTER — Encounter: Payer: Self-pay | Admitting: Cardiology

## 2016-09-01 ENCOUNTER — Ambulatory Visit (INDEPENDENT_AMBULATORY_CARE_PROVIDER_SITE_OTHER): Payer: 59 | Admitting: Cardiology

## 2016-09-01 VITALS — BP 138/98 | HR 84 | Ht 64.0 in | Wt 181.0 lb

## 2016-09-01 DIAGNOSIS — R0789 Other chest pain: Secondary | ICD-10-CM | POA: Insufficient documentation

## 2016-09-01 DIAGNOSIS — E876 Hypokalemia: Secondary | ICD-10-CM

## 2016-09-01 DIAGNOSIS — R030 Elevated blood-pressure reading, without diagnosis of hypertension: Secondary | ICD-10-CM

## 2016-09-01 DIAGNOSIS — E782 Mixed hyperlipidemia: Secondary | ICD-10-CM | POA: Insufficient documentation

## 2016-09-01 HISTORY — DX: Mixed hyperlipidemia: E78.2

## 2016-09-01 NOTE — Progress Notes (Signed)
Cardiology Office Note:    Date:  09/01/2016   ID:  Joyce Russell, DOB [birth year], MRN [id number]  PCP:  Hoyt Koch, MD  Cardiologist:  Jenean Lindau, MD   Referring MD: Hoyt Koch, *    ASSESSMENT:    1. Elevated blood pressure reading without diagnosis of hypertension   2. Hypokalemia   3. Chest discomfort    PLAN:    In order of problems listed above:  1. I had an extensive discussion with the patient about my findings. Her blood pressure was elevated when she saw the doctor recently. She is brought multiple blood pressure record readings from home and is only mildly elevated at this time. She has been advised to take Norvasc 5 mg half tablet daily in the morning. I reassured her about her findings and my findings today. Lifestyle modification was advised she vocalized understanding. 2. She has had hypokalemia so we will check blood work for rotation and medications also today. She has been advised to take potassium rich foods and keep her track for blood work with her primary care physician and she verbalized understanding. She is a Marine scientist by profession. 3. Her lipids are markedly elevated. Diet was explained to her. I also mentioned to her that once his stress test is fine she should start an exercise program. Diet was extensively discussed. She'll be seen in follow-up appointment in 6 earlier if she has any concerns. For this she'll have lipid work so that we can discuss this at next appointment.  4. She has chest discomfort which is atypical for coronary etiology. In view of her risk factors we'll do an exercise stress echo. In the interim if she has any significant symptoms he knows to go to the nearest emergency room.  Medication Adjustments/Labs and Tests Ordered: Current medicines are reviewed at length with the patient today.  Concerns regarding medicines are outlined above.  No orders of the defined types were placed in this encounter.  No orders of  the defined types were placed in this encounter.    History of Present Illness:    Joyce Russell is a 46 y.o. female who is being seen today for the evaluation of Elevated blood pressure and palpitations at the request of Hoyt Koch, *. Patient is a pleasant 46 year old female. She has no significant past medical history. Recently she's been under some stress at home because her son is going away to college and another child is going to kindergarten. She mentions to me that one day she had a large [employer] coffee and had had palpitations. She generally has tea. These palpitations have not recurred since. For these reasons she was seen by her primary care physician. No chest pain orthopnea or PND. She had one episode of chest discomfort not related to exertion. No radiation of the symptoms. She is currently an active lady and activity does not bring about any chest discomfort. At the time of my evaluation she is alert awake oriented and in no distress.  Past Medical History:  Diagnosis Date  . Chronic tension headache   . Ectopic pregnancy 2012   right    Past Surgical History:  Procedure Laterality Date  . SALPINGECTOMY  03/2010   R  . WISDOM TOOTH EXTRACTION      Current Medications: Current Meds  Medication Sig  . ibuprofen (ADVIL,MOTRIN) 200 MG tablet Take 600 mg by mouth every 6 (six) hours as needed for headache.  Allergies:   Patient has no known allergies.   Social History   Social History  . Marital status: Married    Spouse name: N/A  . Number of children: N/A  . Years of education: N/A   Social History Main Topics  . Smoking status: Never Smoker  . Smokeless tobacco: Never Used  . Alcohol use No  . Drug use: No  . Sexual activity: Yes    Birth control/ protection: IUD, Abstinence     Comment: PARAGARD   Other Topics Concern  . None   Social History Narrative   Lives with spouse, 3 children and mother in law   CM @ [facility]     Family  History: The patient's family history includes Cancer in her paternal grandmother; Diabetes in her father, maternal grandfather, and paternal aunt; Glaucoma in her father and mother; Heart disease in her sister; Hypertension in her maternal grandfather, mother, and sister; Stroke (age of onset: 23) in her maternal grandfather.  ROS:   Please see the history of present illness.    All other systems reviewed and are negative.  EKGs/Labs/Other Studies Reviewed:    The following studies were reviewed today: EKG done reveals sinus rhythm and nonspecific ST-T changes. Lab work was reviewed and the patient has significant hypokalemia. Reviewed her lipid profile and had an extensive discussion with her about this.    Recent Labs: 08/09/2016: ALT 22 08/26/2016: BUN 14; Creatinine, Ser 0.80; Hemoglobin 11.4; Magnesium 2.0; Platelets 425; Potassium 2.9; Sodium 135; TSH 1.289  Recent Lipid Panel    Component Value Date/Time   CHOL 251 (H) 08/09/2016 0919   TRIG 64.0 08/09/2016 0919   HDL 45.30 08/09/2016 0919   CHOLHDL 6 08/09/2016 0919   VLDL 12.8 08/09/2016 0919   LDLCALC 193 (H) 08/09/2016 0919   LDLDIRECT 197.5 10/12/2012 1011    Physical Exam:    VS:  BP (!) 138/98   Pulse 84   Ht 5\' 4"  (1.626 m)   Wt 181 lb 0.6 oz (82.1 kg)   LMP 08/19/2016   SpO2 99%   BMI 31.08 kg/m     Wt Readings from Last 3 Encounters:  09/01/16 181 lb 0.6 oz (82.1 kg)  08/08/16 185 lb (83.9 kg)  02/16/15 172 lb (78 kg)     GEN: Patient is in no acute distress HEENT: Normal NECK: No JVD; No carotid bruits LYMPHATICS: No lymphadenopathy CARDIAC: S1 S2 regular, 2/6 systolic murmur at the apex. RESPIRATORY:  Clear to auscultation without rales, wheezing or rhonchi  ABDOMEN: Soft, non-tender, non-distended MUSCULOSKELETAL:  No edema; No deformity  SKIN: Warm and dry NEUROLOGIC:  Alert and oriented x 3 PSYCHIATRIC:  Normal affect    Signed, Jenean Lindau, MD  09/01/2016 10:14 AM    [place]

## 2016-09-01 NOTE — Patient Instructions (Signed)
Medication Instructions:  Your physician recommends that you continue on your current medications as directed. Please refer to the Current Medication list given to you today. 1.) TAKE Norvasc 5 mg 1/2 tablet every morning.   Labwork: Your physician recommends that you have labs drawn in office today to check your kidney function, sodium, potassium and magnesium level.   Testing/Procedures: Your physician has requested that you have a stress echocardiogram. For further information please visit [email or web address]. Please follow instruction sheet as given.  Please report to [address], [address] [place], [place] the day of your testing.   Follow-Up: Your physician recommends that you schedule a follow-up appointment in: 2 months   Any Other Special Instructions Will Be Listed Below (If Applicable).  Please note that any paperwork needing to be filled out by the provider will need to be addressed at the front desk prior to seeing the provider. Please note that any paperwork FMLA, Disability or other documents regarding health condition is subject to a $25.00 charge that must be received prior to completion of paperwork in the form of a money order or check.     If you need a refill on your cardiac medications before your next appointment, please call your pharmacy.

## 2016-09-01 NOTE — Addendum Note (Signed)
Addended by: Kathyrn Sheriff on: 09/01/2016 10:33 AM   Modules accepted: Orders

## 2016-09-02 LAB — BASIC METABOLIC PANEL
BUN / CREAT RATIO: 21 (ref 9–23)
BUN: 15 mg/dL (ref 6–24)
CHLORIDE: 104 mmol/L (ref 96–106)
CO2: 21 mmol/L (ref 20–29)
Calcium: 9.5 mg/dL (ref 8.7–10.2)
Creatinine, Ser: 0.7 mg/dL (ref 0.57–1.00)
GFR calc Af Amer: 122 mL/min/{1.73_m2} (ref >59)
GFR calc non Af Amer: 106 mL/min/{1.73_m2} (ref >59)
GLUCOSE: 85 mg/dL (ref 65–99)
POTASSIUM: 5 mmol/L (ref 3.5–5.2)
SODIUM: 140 mmol/L (ref 134–144)

## 2016-09-02 LAB — MAGNESIUM: MAGNESIUM: 1.9 mg/dL (ref 1.6–2.3)

## 2016-09-06 ENCOUNTER — Other Ambulatory Visit: Payer: Self-pay

## 2016-09-06 DIAGNOSIS — R0789 Other chest pain: Secondary | ICD-10-CM

## 2016-09-27 ENCOUNTER — Other Ambulatory Visit (HOSPITAL_COMMUNITY): Payer: 59

## 2016-10-10 ENCOUNTER — Other Ambulatory Visit (HOSPITAL_COMMUNITY): Payer: 59

## 2016-10-17 ENCOUNTER — Other Ambulatory Visit: Payer: Self-pay

## 2016-10-17 ENCOUNTER — Ambulatory Visit (HOSPITAL_COMMUNITY): Payer: 59 | Attending: Internal Medicine

## 2016-10-17 DIAGNOSIS — R0789 Other chest pain: Secondary | ICD-10-CM

## 2016-10-18 ENCOUNTER — Telehealth: Payer: Self-pay | Admitting: Internal Medicine

## 2016-10-18 NOTE — Telephone Encounter (Signed)
I cannot refill as I have not seen since 01/2015

## 2016-10-18 NOTE — Telephone Encounter (Signed)
Patient requesting refill on Fioricet.  States cardiologist took med off of list b/c she was not taking at the time.  States she had stopped it thinking it was causing her heart palpitations.  States she found out it was not.  Patient would like script sent to [facility] outpatient pharmacy.

## 2016-10-18 NOTE — Telephone Encounter (Signed)
See MD response below. Pls call pt to make appt...Joyce Russell

## 2016-10-19 NOTE — Telephone Encounter (Signed)
I will not be refilling this. I did not evaluate patient for headache during CPE. She needs office visit.

## 2016-10-19 NOTE — Telephone Encounter (Signed)
Since PCP have not seen her since 2017 she decline to refill will forward to [facility] to see if she would refill since she saw her for her CPX in July. [place] pls advise on msg..lmb

## 2016-10-19 NOTE — Telephone Encounter (Signed)
Pt was confused as to why she needed to be seen. She said that she was here on 08/08/16 to see Joyce Russell for her physical during Dr Nathanial Millman absence.

## 2016-10-20 NOTE — Telephone Encounter (Signed)
I have left vm for patient to call back.  Patient needs to scheduled med follow up.

## 2016-10-20 NOTE — Telephone Encounter (Signed)
Forwarding msg back to [facility] both providers decline to refill... As stated from Dr. Sharlet Salina must have ov if needing refills...Joyce Russell

## 2016-10-20 NOTE — Telephone Encounter (Signed)
I have spoken with patient and scheduled with Joyce Russell for 10/5

## 2016-10-21 ENCOUNTER — Encounter: Payer: Self-pay | Admitting: Internal Medicine

## 2016-10-21 ENCOUNTER — Ambulatory Visit (INDEPENDENT_AMBULATORY_CARE_PROVIDER_SITE_OTHER): Payer: 59 | Admitting: Internal Medicine

## 2016-10-21 DIAGNOSIS — G44229 Chronic tension-type headache, not intractable: Secondary | ICD-10-CM

## 2016-10-21 DIAGNOSIS — I10 Essential (primary) hypertension: Secondary | ICD-10-CM

## 2016-10-21 MED ORDER — BUTALBITAL-APAP-CAFFEINE 50-325-40 MG PO TABS: 1.0000 | ORAL_TABLET | Freq: Four times a day (QID) | ORAL | 2 refills | Status: DC | PRN

## 2016-10-21 MED FILL — BUTALB-ACETAMIN-CAFF 50-325: 50-325-40 | 7 days supply | Qty: 60 | Fill #0

## 2016-10-21 NOTE — Assessment & Plan Note (Signed)
She is taking amlodipine 5 mg daily which is not adequate. Asked her to take 1 pill daily and she does not wish to do that at this time.

## 2016-10-21 NOTE — Progress Notes (Signed)
   Subjective:    Patient ID: Joyce Russell, female    DOB: [birth year], 46 y.o.   MRN: [id number]  HPI  The patient is a 46 YO female coming in for headaches. She was given fiorcet over the summer to use instead of ibuprofen for migraines. She stopped taking this due to some palpitations. She was concerned about it giving her palpitations. She was able to take it for headaches. She does not have any medicine for migraines now and gets 4-5 per month and needs something to use. She is monitoring BP at home and usually 120-140/80s at home. Only taking 1/2 pill amlodipine.   Review of Systems  Constitutional: Negative.   HENT: Negative.   Eyes: Negative.   Respiratory: Negative for cough, chest tightness and shortness of breath.   Cardiovascular: Negative for chest pain, palpitations and leg swelling.  Gastrointestinal: Negative for abdominal distention, abdominal pain, constipation, diarrhea, nausea and vomiting.  Musculoskeletal: Negative.   Skin: Negative.   Neurological: Positive for headaches.  Psychiatric/Behavioral: Negative.       Objective:   Physical Exam  Constitutional: She is oriented to person, place, and time. She appears well-developed and well-nourished.  HENT:  Head: Normocephalic and atraumatic.  Eyes: EOM are normal.  Neck: Normal range of motion.  Cardiovascular: Normal rate and regular rhythm.   Pulmonary/Chest: Effort normal and breath sounds normal. No respiratory distress. She has no wheezes. She has no rales.  Abdominal: Soft. Bowel sounds are normal. She exhibits no distension. There is no tenderness. There is no rebound.  Musculoskeletal: She exhibits no edema.  Neurological: She is alert and oriented to person, place, and time. Coordination normal.  Skin: Skin is warm and dry.  Psychiatric: She has a normal mood and affect.   Vitals:   10/21/16 1626  BP: (!) 140/100  Pulse: 79  Temp: 98.1 F (36.7 C)  TempSrc: Oral  SpO2: 100%  Weight: 182 lb (82.6  kg)  Height: 5\' 4"  (1.626 m)      Assessment & Plan:

## 2016-10-21 NOTE — Assessment & Plan Note (Signed)
Rx for fiorcet for headaches. She is not able to take NSAIDs due to elevated BP.

## 2016-10-21 NOTE — Patient Instructions (Signed)
We have given you the prescription.

## 2016-11-03 ENCOUNTER — Ambulatory Visit: Payer: 59 | Admitting: Cardiology

## 2016-11-03 ENCOUNTER — Ambulatory Visit (HOSPITAL_COMMUNITY): Payer: 59 | Attending: Cardiovascular Disease

## 2016-11-03 ENCOUNTER — Ambulatory Visit (HOSPITAL_COMMUNITY): Payer: 59

## 2016-11-03 DIAGNOSIS — E782 Mixed hyperlipidemia: Secondary | ICD-10-CM | POA: Diagnosis not present

## 2016-11-03 DIAGNOSIS — E876 Hypokalemia: Secondary | ICD-10-CM | POA: Diagnosis not present

## 2016-11-03 DIAGNOSIS — R0789 Other chest pain: Secondary | ICD-10-CM | POA: Diagnosis not present

## 2016-11-03 DIAGNOSIS — R030 Elevated blood-pressure reading, without diagnosis of hypertension: Secondary | ICD-10-CM | POA: Diagnosis not present

## 2016-11-07 DIAGNOSIS — Z01419 Encounter for gynecological examination (general) (routine) without abnormal findings: Secondary | ICD-10-CM | POA: Diagnosis not present

## 2016-11-07 DIAGNOSIS — Z124 Encounter for screening for malignant neoplasm of cervix: Secondary | ICD-10-CM | POA: Diagnosis not present

## 2016-11-07 DIAGNOSIS — Z3040 Encounter for surveillance of contraceptives, unspecified: Secondary | ICD-10-CM | POA: Diagnosis not present

## 2016-11-07 DIAGNOSIS — R87618 Other abnormal cytological findings on specimens from cervix uteri: Secondary | ICD-10-CM | POA: Diagnosis not present

## 2016-11-07 DIAGNOSIS — Z6839 Body mass index (BMI) 39.0-39.9, adult: Secondary | ICD-10-CM | POA: Diagnosis not present

## 2016-11-07 DIAGNOSIS — N852 Hypertrophy of uterus: Secondary | ICD-10-CM | POA: Diagnosis not present

## 2016-11-07 DIAGNOSIS — Z1231 Encounter for screening mammogram for malignant neoplasm of breast: Secondary | ICD-10-CM | POA: Diagnosis not present

## 2016-11-07 LAB — HM PAP SMEAR

## 2016-11-07 LAB — HM MAMMOGRAPHY

## 2016-11-14 ENCOUNTER — Ambulatory Visit (INDEPENDENT_AMBULATORY_CARE_PROVIDER_SITE_OTHER): Payer: 59 | Admitting: Cardiology

## 2016-11-14 ENCOUNTER — Encounter: Payer: Self-pay | Admitting: Cardiology

## 2016-11-14 VITALS — BP 138/90 | HR 74 | Ht 64.0 in | Wt 182.8 lb

## 2016-11-14 DIAGNOSIS — I10 Essential (primary) hypertension: Secondary | ICD-10-CM | POA: Diagnosis not present

## 2016-11-14 NOTE — Progress Notes (Signed)
Cardiology Office Note:    Date:  11/14/2016   ID:  Joyce Russell, DOB 1970/09/26, MRN [id number]  PCP:  Hoyt Koch, MD  Cardiologist:  Jenean Lindau, MD   Referring MD: Hoyt Koch, *    ASSESSMENT:    1. Essential hypertension    PLAN:    In order of problems listed above:  1. Blood pressure stable and I have asked her to increase her blood pressure medicine to amlodipine 5 mg daily. She states she's been taking 2.5 mg daily. She will keep her track for blood pressures. I told her that her: Blood pressure should be around 130/70 mmHg. 2. I discussed diet. Importance of exercise was stressed. I told her that she needs to lose weight with diet and exercise and that lifestyle modification will help her blood pressure too. She vocalized understanding. She will be seen in follow-up appointment in 6 months or earlier if she has any concerns.   Medication Adjustments/Labs and Tests Ordered: Current medicines are reviewed at length with the patient today.  Concerns regarding medicines are outlined above.  No orders of the defined types were placed in this encounter.  No orders of the defined types were placed in this encounter.    Chief Complaint  Patient presents with  . Follow-up    doing well      History of Present Illness:    Joyce Russell is a 46 y.o. female . Patient was evaluated for essential hypertension. She denies any problems at this time and takes care of activities of daily living. No chest pain orthopnea or PND. She's much relaxed now and her blood pressures better. She Is adjusting to the changes of her domestic life much better. No chest pain orthopnea or PND. At the time of my evaluation she is alert awake oriented and in no distress.   Past Medical History:  Diagnosis Date  . Chronic tension headache   . Ectopic pregnancy 2012   right    Past Surgical History:  Procedure Laterality Date  . SALPINGECTOMY  03/2010   R  .  WISDOM TOOTH EXTRACTION      Current Medications: Current Meds  Medication Sig  . amLODipine (NORVASC) 10 MG tablet Take 0.5 tablets (5 mg total) by mouth at bedtime.  . butalbital-acetaminophen-caffeine (FIORICET, ESGIC) 50-325-40 MG tablet Take 1-2 tablets by mouth every 6 (six) hours as needed for headache.  . ibuprofen (ADVIL,MOTRIN) 200 MG tablet Take 600 mg by mouth every 6 (six) hours as needed for headache.     Allergies:   Patient has no known allergies.   Social History   Social History  . Marital status: Married    Spouse name: N/A  . Number of children: N/A  . Years of education: N/A   Social History Main Topics  . Smoking status: Never Smoker  . Smokeless tobacco: Never Used  . Alcohol use No  . Drug use: No  . Sexual activity: Yes    Birth control/ protection: IUD, Abstinence     Comment: PARAGARD   Other Topics Concern  . None   Social History Narrative   Lives with spouse, 3 children and mother in law   CM @ [facility]     Family History: The patient's family history includes Cancer in her paternal grandmother; Diabetes in her father, maternal grandfather, and paternal aunt; Glaucoma in her father and mother; Heart disease in her sister; Hypertension in her maternal grandfather, mother, and  sister; Stroke (age of onset: 73) in her maternal grandfather.  ROS:   Please see the history of present illness.    All other systems reviewed and are negative.  EKGs/Labs/Other Studies Reviewed:    The following studies were reviewed today: I discussed my findings with the patient at extensive length and discuss test was discussed and she is happy about it.   Recent Labs: 08/09/2016: ALT 22 08/26/2016: Hemoglobin 11.4; Platelets 425; TSH 1.289 09/01/2016: BUN 15; Creatinine, Ser 0.70; Magnesium 1.9; Potassium 5.0; Sodium 140  Recent Lipid Panel    Component Value Date/Time   CHOL 251 (H) 08/09/2016 0919   TRIG 64.0 08/09/2016 0919   HDL 45.30 08/09/2016  0919   CHOLHDL 6 08/09/2016 0919   VLDL 12.8 08/09/2016 0919   LDLCALC 193 (H) 08/09/2016 0919   LDLDIRECT 197.5 10/12/2012 1011    Physical Exam:    VS:  BP 138/90   Pulse 74   Ht 5\' 4"  (1.626 m)   Wt 182 lb 12.8 oz (82.9 kg)   SpO2 98%   BMI 31.38 kg/m     Wt Readings from Last 3 Encounters:  11/14/16 182 lb 12.8 oz (82.9 kg)  10/21/16 182 lb (82.6 kg)  09/01/16 181 lb 0.6 oz (82.1 kg)     GEN: Patient is in no acute distress HEENT: Normal NECK: No JVD; No carotid bruits LYMPHATICS: No lymphadenopathy CARDIAC: Hear sounds regular, 2/6 systolic murmur at the apex. RESPIRATORY:  Clear to auscultation without rales, wheezing or rhonchi  ABDOMEN: Soft, non-tender, non-distended MUSCULOSKELETAL:  No edema; No deformity  SKIN: Warm and dry NEUROLOGIC:  Alert and oriented x 3 PSYCHIATRIC:  Normal affect   Signed, Jenean Lindau, MD  11/14/2016 9:19 AM    [place]

## 2016-11-14 NOTE — Patient Instructions (Signed)
Medication Instructions:  Your physician recommends that you continue on your current medications as directed. Please refer to the Current Medication list given to you today.   Labwork: None  Testing/Procedures: None  Follow-Up: Your physician recommends that you schedule a follow-up appointment in: 6 months   Any Other Special Instructions Will Be Listed Below (If Applicable).     If you need a refill on your cardiac medications before your next appointment, please call your pharmacy.   [place], RN, BSN    Hypertension Hypertension is another name for high blood pressure. High blood pressure forces your heart to work harder to pump blood. This can cause problems over time. There are two numbers in a blood pressure reading. There is a top number (systolic) over a bottom number (diastolic). It is best to have a blood pressure below 120/80. Healthy choices can help lower your blood pressure. You may need medicine to help lower your blood pressure if:  Your blood pressure cannot be lowered with healthy choices.  Your blood pressure is higher than 130/80.  Follow these instructions at home: Eating and drinking  If directed, follow the DASH eating plan. This diet includes: ? Filling half of your plate at each meal with fruits and vegetables. ? Filling one quarter of your plate at each meal with whole grains. Whole grains include whole wheat pasta, brown rice, and whole grain bread. ? Eating or drinking low-fat dairy products, such as skim milk or low-fat yogurt. ? Filling one quarter of your plate at each meal with low-fat (lean) proteins. Low-fat proteins include fish, skinless chicken, eggs, beans, and tofu. ? Avoiding fatty meat, cured and processed meat, or chicken with skin. ? Avoiding premade or processed food.  Eat less than 1,500 mg of salt (sodium) a day.  Limit alcohol use to no more than 1 drink a day for nonpregnant women and 2 drinks a day  for men. One drink equals 12 oz of beer, 5 oz of wine, or 1 oz of hard liquor. Lifestyle  Work with your doctor to stay at a healthy weight or to lose weight. Ask your doctor what the best weight is for you.  Get at least 30 minutes of exercise that causes your heart to beat faster (aerobic exercise) most days of the week. This may include walking, swimming, or biking.  Get at least 30 minutes of exercise that strengthens your muscles (resistance exercise) at least 3 days a week. This may include lifting weights or pilates.  Do not use any products that contain nicotine or tobacco. This includes cigarettes and e-cigarettes. If you need help quitting, ask your doctor.  Check your blood pressure at home as told by your doctor.  Keep all follow-up visits as told by your doctor. This is important. Medicines  Take over-the-counter and prescription medicines only as told by your doctor. Follow directions carefully.  Do not skip doses of blood pressure medicine. The medicine does not work as well if you skip doses. Skipping doses also puts you at risk for problems.  Ask your doctor about side effects or reactions to medicines that you should watch for. Contact a doctor if:  You think you are having a reaction to the medicine you are taking.  You have headaches that keep coming back (recurring).  You feel dizzy.  You have swelling in your ankles.  You have trouble with your vision. Get help right away if:  You get a very bad  headache.  You start to feel confused.  You feel weak or numb.  You feel faint.  You get very bad pain in your: ? Chest. ? Belly (abdomen).  You throw up (vomit) more than once.  You have trouble breathing. Summary  Hypertension is another name for high blood pressure.  Making healthy choices can help lower blood pressure. If your blood pressure cannot be controlled with healthy choices, you may need to take medicine. This information is not  intended to replace advice given to you by your health care provider. Make sure you discuss any questions you have with your health care provider. Document Released: 06/22/2007 Document Revised: 12/02/2015 Document Reviewed: 12/02/2015 Elsevier Interactive Patient Education  [employer].

## 2016-12-07 MED FILL — AZITHROMYCIN 500 MG TABLET: 500 | 3 days supply | Qty: 3 | Fill #0

## 2016-12-07 MED FILL — MEFLOQUINE HCL 250 MG TAB: 250 | 63 days supply | Qty: 9 | Fill #0

## 2017-02-27 ENCOUNTER — Other Ambulatory Visit: Payer: Self-pay | Admitting: Nurse Practitioner

## 2017-02-27 DIAGNOSIS — R030 Elevated blood-pressure reading, without diagnosis of hypertension: Secondary | ICD-10-CM

## 2017-02-27 MED FILL — AMLODIPINE BESYLATE 10 MG T: 10 | 30 days supply | Qty: 15 | Fill #0

## 2017-03-20 DIAGNOSIS — N83201 Unspecified ovarian cyst, right side: Secondary | ICD-10-CM | POA: Diagnosis not present

## 2017-03-31 MED FILL — AMLODIPINE BESYLATE 10 MG T: 10 | 30 days supply | Qty: 15 | Fill #1

## 2017-05-03 MED FILL — AMLODIPINE BESYLATE 10 MG T: 10 | 30 days supply | Qty: 15 | Fill #2

## 2017-06-13 ENCOUNTER — Other Ambulatory Visit: Payer: Self-pay | Admitting: Internal Medicine

## 2017-06-13 DIAGNOSIS — R030 Elevated blood-pressure reading, without diagnosis of hypertension: Secondary | ICD-10-CM

## 2017-06-13 MED FILL — AMLODIPINE BESYLATE 10 MG T: 10 | 30 days supply | Qty: 15 | Fill #0

## 2017-06-16 ENCOUNTER — Ambulatory Visit: Payer: 59 | Admitting: Cardiology

## 2017-06-20 ENCOUNTER — Encounter: Payer: Self-pay | Admitting: Cardiology

## 2017-06-20 ENCOUNTER — Ambulatory Visit (INDEPENDENT_AMBULATORY_CARE_PROVIDER_SITE_OTHER): Payer: 59 | Admitting: Cardiology

## 2017-06-20 VITALS — BP 130/82 | HR 80 | Ht 64.0 in | Wt 170.8 lb

## 2017-06-20 DIAGNOSIS — I10 Essential (primary) hypertension: Secondary | ICD-10-CM

## 2017-06-20 DIAGNOSIS — E782 Mixed hyperlipidemia: Secondary | ICD-10-CM

## 2017-06-20 NOTE — Progress Notes (Signed)
Cardiology Office Note:    Date:  06/20/2017   ID:  Joyce Russell, DOB [birth year], MRN [id number]  PCP:  Hoyt Koch, MD  Cardiologist:  Jenean Lindau, MD   Referring MD: Hoyt Koch, *    ASSESSMENT:    1. Essential hypertension   2. Mixed hyperlipidemia    PLAN:    In order of problems listed above:  1. Primary prevention stressed with the patient.  Importance of compliance with diet and medications stressed.  I congratulated the patient about her blood pressure and dietary intervention.  Importance of regular exercise stressed and she vocalized understanding. 2. I will recheck her lipids in the next few days and if there is still elevated would like to initiate her on low-dose statin.  Benefits and risks explained and she vocalized understanding. 3. Importance of regular exercise stressed.Patient will be seen in follow-up appointment in 6 months or earlier if the patient has any concerns    Medication Adjustments/Labs and Tests Ordered: Current medicines are reviewed at length with the patient today.  Concerns regarding medicines are outlined above.  Orders Placed This Encounter  Procedures  . Basic Metabolic Panel (BMET)  . Hepatic function panel  . Lipid Profile   No orders of the defined types were placed in this encounter.    No chief complaint on file.    History of Present Illness:    Joyce Russell is a 47 y.o. female.Marland Kitchen  She was evaluated for chest pain symptoms and these were unremarkable.  She has essential hypertension and dyslipidemia.  She denies any problems at this time and takes care of activities of daily living.  No chest pain orthopnea or PND.  She leads a sedentary lifestyle.  She mentions to me that she is done well with her diet and has lost weight through dietary intervention.  Past Medical History:  Diagnosis Date  . Chronic tension headache   . Ectopic pregnancy 2012   right    Past Surgical History:  Procedure  Laterality Date  . SALPINGECTOMY  03/2010   R  . WISDOM TOOTH EXTRACTION      Current Medications: Current Meds  Medication Sig  . amLODipine (NORVASC) 10 MG tablet TAKE 1/2 TABLET BY MOUTH AT BEDTIME  . butalbital-acetaminophen-caffeine (FIORICET, ESGIC) 50-325-40 MG tablet Take 1-2 tablets by mouth every 6 (six) hours as needed for headache.  . ibuprofen (ADVIL,MOTRIN) 200 MG tablet Take 600 mg by mouth every 6 (six) hours as needed for headache.     Allergies:   Patient has no known allergies.   Social History   Socioeconomic History  . Marital status: Married    Spouse name: Not on file  . Number of children: Not on file  . Years of education: Not on file  . Highest education level: Not on file  Occupational History  . Not on file  Social Needs  . Financial resource strain: Not on file  . Food insecurity:    Worry: Not on file    Inability: Not on file  . Transportation needs:    Medical: Not on file    Non-medical: Not on file  Tobacco Use  . Smoking status: Never Smoker  . Smokeless tobacco: Never Used  Substance and Sexual Activity  . Alcohol use: No  . Drug use: No  . Sexual activity: Yes    Birth control/protection: IUD, Abstinence    Comment: PARAGARD  Lifestyle  . Physical activity:  Days per week: Not on file    Minutes per session: Not on file  . Stress: Not on file  Relationships  . Social connections:    Talks on phone: Not on file    Gets together: Not on file    Attends religious service: Not on file    Active member of club or organization: Not on file    Attends meetings of clubs or organizations: Not on file    Relationship status: Not on file  Other Topics Concern  . Not on file  Social History Narrative   Lives with spouse, 3 children and mother in law   CM @ [facility]     Family History: The patient's family history includes Cancer in her paternal grandmother; Diabetes in her father, maternal grandfather, and paternal aunt;  Glaucoma in her father and mother; Heart disease in her sister; Hypertension in her maternal grandfather, mother, and sister; Stroke (age of onset: 54) in her maternal grandfather.  ROS:   Please see the history of present illness.    All other systems reviewed and are negative.  EKGs/Labs/Other Studies Reviewed:    The following studies were reviewed today: Results of stress test and echocardiogram were discussed with the patient at length.   Recent Labs: 08/09/2016: ALT 22 08/26/2016: Hemoglobin 11.4; Platelets 425; TSH 1.289 09/01/2016: BUN 15; Creatinine, Ser 0.70; Magnesium 1.9; Potassium 5.0; Sodium 140  Recent Lipid Panel    Component Value Date/Time   CHOL 251 (H) 08/09/2016 0919   TRIG 64.0 08/09/2016 0919   HDL 45.30 08/09/2016 0919   CHOLHDL 6 08/09/2016 0919   VLDL 12.8 08/09/2016 0919   LDLCALC 193 (H) 08/09/2016 0919   LDLDIRECT 197.5 10/12/2012 1011    Physical Exam:    VS:  BP 130/82 (BP Location: Left Arm, Patient Position: Sitting, Cuff Size: Normal)   Pulse 80   Ht 5\' 4"  (1.626 m)   Wt 170 lb 12.8 oz (77.5 kg)   SpO2 99%   BMI 29.32 kg/m     Wt Readings from Last 3 Encounters:  06/20/17 170 lb 12.8 oz (77.5 kg)  11/14/16 182 lb 12.8 oz (82.9 kg)  10/21/16 182 lb (82.6 kg)     GEN: Patient is in no acute distress HEENT: Normal NECK: No JVD; No carotid bruits LYMPHATICS: No lymphadenopathy CARDIAC: Hear sounds regular, 2/6 systolic murmur at the apex. RESPIRATORY:  Clear to auscultation without rales, wheezing or rhonchi  ABDOMEN: Soft, non-tender, non-distended MUSCULOSKELETAL:  No edema; No deformity  SKIN: Warm and dry NEUROLOGIC:  Alert and oriented x 3 PSYCHIATRIC:  Normal affect   Signed, Jenean Lindau, MD  06/20/2017 3:09 PM    [facility]

## 2017-06-20 NOTE — Patient Instructions (Signed)
Medication Instructions:  Your physician recommends that you continue on your current medications as directed. Please refer to the Current Medication list given to you today.   Labwork: Your physician recommends that you return for lab work: BMP, Hepatic function panel, Lipid panel    Testing/Procedures: None  Follow-Up: Your physician recommends that you schedule a follow-up appointment in: 6 Months   Any Other Special Instructions Will Be Listed Below (If Applicable).     If you need a refill on your cardiac medications before your next appointment, please call your pharmacy.

## 2017-06-26 DIAGNOSIS — I10 Essential (primary) hypertension: Secondary | ICD-10-CM | POA: Diagnosis not present

## 2017-06-26 DIAGNOSIS — E782 Mixed hyperlipidemia: Secondary | ICD-10-CM | POA: Diagnosis not present

## 2017-06-28 LAB — HEPATIC FUNCTION PANEL
ALBUMIN: 4 g/dL (ref 3.5–5.5)
ALK PHOS: 68 IU/L (ref 39–117)
ALT: 53 IU/L — ABNORMAL HIGH (ref 0–32)
AST: 32 IU/L (ref 0–40)
BILIRUBIN TOTAL: 0.2 mg/dL (ref 0.0–1.2)
BILIRUBIN, DIRECT: 0.07 mg/dL (ref 0.00–0.40)
TOTAL PROTEIN: 7.8 g/dL (ref 6.0–8.5)

## 2017-06-28 LAB — LIPID PANEL
CHOL/HDL RATIO: 4.7 ratio — AB (ref 0.0–4.4)
CHOLESTEROL TOTAL: 251 mg/dL — AB (ref 100–199)
HDL: 53 mg/dL (ref >39)
LDL CALC: 184 mg/dL — AB (ref 0–99)
Triglycerides: 68 mg/dL (ref 0–149)
VLDL CHOLESTEROL CAL: 14 mg/dL (ref 5–40)

## 2017-06-28 LAB — BASIC METABOLIC PANEL
BUN / CREAT RATIO: 18 (ref 9–23)
BUN: 14 mg/dL (ref 6–24)
CALCIUM: 9.3 mg/dL (ref 8.7–10.2)
CHLORIDE: 100 mmol/L (ref 96–106)
CO2: 24 mmol/L (ref 20–29)
CREATININE: 0.78 mg/dL (ref 0.57–1.00)
GFR calc Af Amer: 106 mL/min/{1.73_m2} (ref >59)
GFR calc non Af Amer: 92 mL/min/{1.73_m2} (ref >59)
GLUCOSE: 79 mg/dL (ref 65–99)
Potassium: 4.7 mmol/L (ref 3.5–5.2)
Sodium: 139 mmol/L (ref 134–144)

## 2017-07-25 MED FILL — BUTALB-ACETAMIN-CAFF 50-325: 50-325-40 | 7 days supply | Qty: 60 | Fill #1

## 2017-07-25 MED FILL — AMLODIPINE BESYLATE 10 MG T: 10 | 30 days supply | Qty: 15 | Fill #1

## 2017-08-22 MED FILL — AMLODIPINE BESYLATE 10 MG T: 10 | 30 days supply | Qty: 15 | Fill #2

## 2017-09-19 ENCOUNTER — Encounter: Payer: Self-pay | Admitting: Internal Medicine

## 2017-09-19 ENCOUNTER — Other Ambulatory Visit (INDEPENDENT_AMBULATORY_CARE_PROVIDER_SITE_OTHER): Payer: 59

## 2017-09-19 ENCOUNTER — Ambulatory Visit (INDEPENDENT_AMBULATORY_CARE_PROVIDER_SITE_OTHER): Payer: 59 | Admitting: Internal Medicine

## 2017-09-19 VITALS — BP 118/90 | HR 76 | Temp 97.8°F | Ht 64.0 in | Wt 167.0 lb

## 2017-09-19 DIAGNOSIS — Z0000 Encounter for general adult medical examination without abnormal findings: Secondary | ICD-10-CM

## 2017-09-19 DIAGNOSIS — R030 Elevated blood-pressure reading, without diagnosis of hypertension: Secondary | ICD-10-CM

## 2017-09-19 DIAGNOSIS — E782 Mixed hyperlipidemia: Secondary | ICD-10-CM | POA: Diagnosis not present

## 2017-09-19 DIAGNOSIS — I10 Essential (primary) hypertension: Secondary | ICD-10-CM

## 2017-09-19 LAB — COMPREHENSIVE METABOLIC PANEL
ALT: 20 U/L (ref 0–35)
AST: 17 U/L (ref 0–37)
Albumin: 4.2 g/dL (ref 3.5–5.2)
Alkaline Phosphatase: 55 U/L (ref 39–117)
BILIRUBIN TOTAL: 0.4 mg/dL (ref 0.2–1.2)
BUN: 10 mg/dL (ref 6–23)
CALCIUM: 9.3 mg/dL (ref 8.4–10.5)
CHLORIDE: 102 meq/L (ref 96–112)
CO2: 25 meq/L (ref 19–32)
Creatinine, Ser: 0.76 mg/dL (ref 0.40–1.20)
GFR: 105.59 mL/min (ref >60.00)
GLUCOSE: 86 mg/dL (ref 70–99)
POTASSIUM: 3.6 meq/L (ref 3.5–5.1)
Sodium: 136 mEq/L (ref 135–145)
Total Protein: 8.5 g/dL — ABNORMAL HIGH (ref 6.0–8.3)

## 2017-09-19 LAB — CBC
HCT: 36.4 % (ref 36.0–46.0)
Hemoglobin: 11.7 g/dL — ABNORMAL LOW (ref 12.0–15.0)
MCHC: 32.2 g/dL (ref 30.0–36.0)
MCV: 70.1 fl — ABNORMAL LOW (ref 78.0–100.0)
Platelets: 311 10*3/uL (ref 150.0–400.0)
RBC: 5.2 Mil/uL — ABNORMAL HIGH (ref 3.87–5.11)
RDW: 19.7 % — AB (ref 11.5–15.5)
WBC: 3.7 10*3/uL — ABNORMAL LOW (ref 4.0–10.5)

## 2017-09-19 LAB — LIPID PANEL
CHOL/HDL RATIO: 6
Cholesterol: 262 mg/dL — ABNORMAL HIGH (ref 0–200)
HDL: 45.2 mg/dL (ref >39.00)
LDL Cholesterol: 200 mg/dL — ABNORMAL HIGH (ref 0–99)
NONHDL: 217.14
TRIGLYCERIDES: 85 mg/dL (ref 0.0–149.0)
VLDL: 17 mg/dL (ref 0.0–40.0)

## 2017-09-19 LAB — HEMOGLOBIN A1C: Hgb A1c MFr Bld: 5.5 % (ref 4.6–6.5)

## 2017-09-19 MED ORDER — AMLODIPINE BESYLATE 5 MG PO TABS: 5.0000 mg | ORAL_TABLET | Freq: Every day | ORAL | 3 refills | Status: DC

## 2017-09-19 MED FILL — AMLODIPINE BESYLATE 5 MG TA: 5 | 90 days supply | Qty: 90 | Fill #0

## 2017-09-19 NOTE — Assessment & Plan Note (Signed)
Checking lipid panel and adjust as needed. Likely familial as she has made significant changes to diet without much improvement in lipids.

## 2017-09-19 NOTE — Patient Instructions (Addendum)

## 2017-09-19 NOTE — Assessment & Plan Note (Signed)
Refill amlodipine and change to 5 mg tablets as her BP is controlled on this. Checking CMP and adjust as needed.

## 2017-09-19 NOTE — Assessment & Plan Note (Signed)
Flu shot at work. Tetanus up to date. Mammogram up to date with gyn, pap smear up to date with gyn and will get records. Counseled about sun safety and mole surveillance. Counseled about the dangers of distracted driving. Given 10 year screening recommendations.

## 2017-09-19 NOTE — Progress Notes (Signed)
   Subjective:    Patient ID: Joyce Russell, female    DOB: Dec 26, 1970, 47 y.o.   MRN: [id number]  HPI The patient is a 47 YO female coming in for physical. No new concerns. Pap and mammogram at [facility] [place] [facility].  PMH, [facility], social history reviewed and updated.   Review of Systems  Constitutional: Negative.   HENT: Negative.   Eyes: Negative.   Respiratory: Negative for cough, chest tightness and shortness of breath.   Cardiovascular: Negative for chest pain, palpitations and leg swelling.  Gastrointestinal: Negative for abdominal distention, abdominal pain, constipation, diarrhea, nausea and vomiting.  Musculoskeletal: Negative.   Skin: Negative.   Neurological: Negative.   Psychiatric/Behavioral: Negative.       Objective:   Physical Exam  Constitutional: She is oriented to person, place, and time. She appears well-developed and well-nourished.  HENT:  Head: Normocephalic and atraumatic.  Eyes: EOM are normal.  Neck: Normal range of motion.  Cardiovascular: Normal rate and regular rhythm.  Pulmonary/Chest: Effort normal and breath sounds normal. No respiratory distress. She has no wheezes. She has no rales.  Abdominal: Soft. Bowel sounds are normal. She exhibits no distension. There is no tenderness. There is no rebound.  Musculoskeletal: She exhibits no edema.  Neurological: She is alert and oriented to person, place, and time. Coordination normal.  Skin: Skin is warm and dry.  Psychiatric: She has a normal mood and affect.   Vitals:   09/19/17 0957  BP: 118/90  Pulse: 76  Temp: 97.8 F (36.6 C)  TempSrc: Oral  SpO2: 99%  Weight: 167 lb (75.8 kg)  Height: 5\' 4"  (1.626 m)      Assessment & Plan:

## 2017-09-22 ENCOUNTER — Encounter: Payer: Self-pay | Admitting: Internal Medicine

## 2017-09-22 NOTE — Progress Notes (Signed)
Abstracted and sent to scan  

## 2017-11-29 DIAGNOSIS — Z01419 Encounter for gynecological examination (general) (routine) without abnormal findings: Secondary | ICD-10-CM | POA: Diagnosis not present

## 2017-11-29 DIAGNOSIS — Z1231 Encounter for screening mammogram for malignant neoplasm of breast: Secondary | ICD-10-CM | POA: Diagnosis not present

## 2017-11-29 DIAGNOSIS — Z3040 Encounter for surveillance of contraceptives, unspecified: Secondary | ICD-10-CM | POA: Diagnosis not present

## 2017-11-29 DIAGNOSIS — Z6829 Body mass index (BMI) 29.0-29.9, adult: Secondary | ICD-10-CM | POA: Diagnosis not present

## 2017-11-29 DIAGNOSIS — N83201 Unspecified ovarian cyst, right side: Secondary | ICD-10-CM | POA: Diagnosis not present

## 2018-01-19 MED FILL — AMLODIPINE BESYLATE 5 MG TA: 5 | 90 days supply | Qty: 90 | Fill #1

## 2018-06-03 MED FILL — AMLODIPINE BESYLATE 5 MG TA: 5 | 90 days supply | Qty: 90 | Fill #2

## 2018-09-17 MED FILL — AMLODIPINE BESYLATE 5 MG TA: 5 | 90 days supply | Qty: 90 | Fill #3

## 2018-10-19 DIAGNOSIS — N921 Excessive and frequent menstruation with irregular cycle: Secondary | ICD-10-CM | POA: Diagnosis not present

## 2018-10-25 ENCOUNTER — Other Ambulatory Visit: Payer: Self-pay | Admitting: Obstetrics and Gynecology

## 2018-10-25 DIAGNOSIS — N921 Excessive and frequent menstruation with irregular cycle: Secondary | ICD-10-CM

## 2018-11-01 ENCOUNTER — Ambulatory Visit
Admission: RE | Admit: 2018-11-01 | Discharge: 2018-11-01 | Disposition: A | Discharge status: Home / Self Care | Payer: 59 | Source: Ambulatory Visit | Attending: Obstetrics and Gynecology | Admitting: Obstetrics and Gynecology

## 2018-11-01 DIAGNOSIS — N859 Noninflammatory disorder of uterus, unspecified: Secondary | ICD-10-CM | POA: Diagnosis not present

## 2018-11-01 DIAGNOSIS — N921 Excessive and frequent menstruation with irregular cycle: Secondary | ICD-10-CM

## 2018-11-06 DIAGNOSIS — D259 Leiomyoma of uterus, unspecified: Secondary | ICD-10-CM | POA: Insufficient documentation

## 2018-11-06 HISTORY — DX: Leiomyoma of uterus, unspecified: D25.9

## 2018-11-07 ENCOUNTER — Other Ambulatory Visit: Payer: Self-pay | Admitting: Internal Medicine

## 2018-11-08 ENCOUNTER — Other Ambulatory Visit: Payer: Self-pay

## 2018-11-08 MED ORDER — BUTALBITAL-APAP-CAFFEINE 50-325-40 MG PO TABS: 1.0000 | ORAL_TABLET | Freq: Four times a day (QID) | ORAL | 0 refills | Status: DC | PRN

## 2018-11-08 MED FILL — BUTALB-ACETAMIN-CAFF 50-325: 50-325-40 | 4 days supply | Qty: 30 | Fill #0

## 2018-11-08 NOTE — Addendum Note (Signed)
Addended by: Pricilla Holm A on: 11/08/2018 04:07 PM   Modules accepted: Orders

## 2018-11-08 NOTE — Telephone Encounter (Signed)
Does she need this prior to visit on 11/12/18?

## 2018-11-08 NOTE — Telephone Encounter (Signed)
Last CPE was 09/19/17 so she can have CPE anytime. Can offer sooner apt if we have one than 26th

## 2018-11-08 NOTE — Telephone Encounter (Signed)
Called patient states that the visit on the 26th was made so she could get the fioicet. States that she wanted to do a CPE as well but was told she had to do it a week out from that visit. Would like to get the refill wondering if she is able to have the CPE on the 26th or does she need to wait for another day?

## 2018-11-08 NOTE — Telephone Encounter (Signed)
Patient informed of MD response and stated understanding will want CPE on the 26th

## 2018-11-12 ENCOUNTER — Ambulatory Visit (INDEPENDENT_AMBULATORY_CARE_PROVIDER_SITE_OTHER): Payer: 59 | Admitting: Internal Medicine

## 2018-11-12 ENCOUNTER — Other Ambulatory Visit (INDEPENDENT_AMBULATORY_CARE_PROVIDER_SITE_OTHER): Payer: 59

## 2018-11-12 ENCOUNTER — Encounter: Payer: Self-pay | Admitting: Internal Medicine

## 2018-11-12 ENCOUNTER — Other Ambulatory Visit: Payer: Self-pay

## 2018-11-12 VITALS — BP 112/86 | HR 65 | Temp 97.8°F | Ht 64.0 in | Wt 184.0 lb

## 2018-11-12 DIAGNOSIS — G44229 Chronic tension-type headache, not intractable: Secondary | ICD-10-CM

## 2018-11-12 DIAGNOSIS — E782 Mixed hyperlipidemia: Secondary | ICD-10-CM | POA: Diagnosis not present

## 2018-11-12 DIAGNOSIS — I10 Essential (primary) hypertension: Secondary | ICD-10-CM

## 2018-11-12 DIAGNOSIS — Z23 Encounter for immunization: Secondary | ICD-10-CM | POA: Diagnosis not present

## 2018-11-12 DIAGNOSIS — Z0000 Encounter for general adult medical examination without abnormal findings: Secondary | ICD-10-CM | POA: Diagnosis not present

## 2018-11-12 LAB — CBC
HCT: 39.3 % (ref 36.0–46.0)
Hemoglobin: 12.7 g/dL (ref 12.0–15.0)
MCHC: 32.2 g/dL (ref 30.0–36.0)
MCV: 75 fl — ABNORMAL LOW (ref 78.0–100.0)
Platelets: 312 10*3/uL (ref 150.0–400.0)
RBC: 5.24 Mil/uL — ABNORMAL HIGH (ref 3.87–5.11)
RDW: 17.6 % — ABNORMAL HIGH (ref 11.5–15.5)
WBC: 5.3 10*3/uL (ref 4.0–10.5)

## 2018-11-12 LAB — COMPREHENSIVE METABOLIC PANEL
ALT: 26 U/L (ref 0–35)
AST: 21 U/L (ref 0–37)
Albumin: 4.1 g/dL (ref 3.5–5.2)
Alkaline Phosphatase: 66 U/L (ref 39–117)
BUN: 15 mg/dL (ref 6–23)
CO2: 27 mEq/L (ref 19–32)
Calcium: 9.3 mg/dL (ref 8.4–10.5)
Chloride: 103 mEq/L (ref 96–112)
Creatinine, Ser: 0.73 mg/dL (ref 0.40–1.20)
GFR: 103.55 mL/min (ref >60.00)
Glucose, Bld: 85 mg/dL (ref 70–99)
Potassium: 3.9 mEq/L (ref 3.5–5.1)
Sodium: 139 mEq/L (ref 135–145)
Total Bilirubin: 0.4 mg/dL (ref 0.2–1.2)
Total Protein: 8.3 g/dL (ref 6.0–8.3)

## 2018-11-12 LAB — LIPID PANEL
Cholesterol: 268 mg/dL — ABNORMAL HIGH (ref 0–200)
HDL: 42.7 mg/dL (ref >39.00)
LDL Cholesterol: 208 mg/dL — ABNORMAL HIGH (ref 0–99)
NonHDL: 225.3
Total CHOL/HDL Ratio: 6
Triglycerides: 87 mg/dL (ref 0.0–149.0)
VLDL: 17.4 mg/dL (ref 0.0–40.0)

## 2018-11-12 LAB — HEMOGLOBIN A1C: Hgb A1c MFr Bld: 5.4 % (ref 4.6–6.5)

## 2018-11-12 NOTE — Assessment & Plan Note (Signed)
Taking fioricet and refilled. She uses rarely.

## 2018-11-12 NOTE — Patient Instructions (Signed)
Health Maintenance, Female Adopting a healthy lifestyle and getting preventive care are important in promoting health and wellness. Ask your health care provider about:  The right schedule for you to have regular tests and exams.  Things you can do on your own to prevent diseases and keep yourself healthy. What should I know about diet, weight, and exercise? Eat a healthy diet   Eat a diet that includes plenty of vegetables, fruits, low-fat dairy products, and lean protein.  Do not eat a lot of foods that are high in solid fats, added sugars, or sodium. Maintain a healthy weight Body mass index (BMI) is used to identify weight problems. It estimates body fat based on height and weight. Your health care provider can help determine your BMI and help you achieve or maintain a healthy weight. Get regular exercise Get regular exercise. This is one of the most important things you can do for your health. Most adults should:  Exercise for at least 150 minutes each week. The exercise should increase your heart rate and make you sweat (moderate-intensity exercise).  Do strengthening exercises at least twice a week. This is in addition to the moderate-intensity exercise.  Spend less time sitting. Even light physical activity can be beneficial. Watch cholesterol and blood lipids Have your blood tested for lipids and cholesterol at 46 years of age, then have this test every 5 years. Have your cholesterol levels checked more often if:  Your lipid or cholesterol levels are high.  You are older than 46 years of age.  You are at high risk for heart disease. What should I know about cancer screening? Depending on your health history and family history, you may need to have cancer screening at various ages. This may include screening for:  Breast cancer.  Cervical cancer.  Colorectal cancer.  Skin cancer.  Lung cancer. What should I know about heart disease, diabetes, and high blood  pressure? Blood pressure and heart disease  High blood pressure causes heart disease and increases the risk of stroke. This is more likely to develop in people who have high blood pressure readings, are of African descent, or are overweight.  Have your blood pressure checked: ? Every 3-5 years if you are 18-39 years of age. ? Every year if you are 40 years old or older. Diabetes Have regular diabetes screenings. This checks your fasting blood sugar level. Have the screening done:  Once every three years after age 40 if you are at a normal weight and have a low risk for diabetes.  More often and at a younger age if you are overweight or have a high risk for diabetes. What should I know about preventing infection? Hepatitis B If you have a higher risk for hepatitis B, you should be screened for this virus. Talk with your health care provider to find out if you are at risk for hepatitis B infection. Hepatitis C Testing is recommended for:  Everyone born from 1945 through 1965.  Anyone with known risk factors for hepatitis C. Sexually transmitted infections (STIs)  Get screened for STIs, including gonorrhea and chlamydia, if: ? You are sexually active and are younger than 46 years of age. ? You are older than 46 years of age and your health care provider tells you that you are at risk for this type of infection. ? Your sexual activity has changed since you were last screened, and you are at increased risk for chlamydia or gonorrhea. Ask your health care provider if   you are at risk.  Ask your health care provider about whether you are at high risk for HIV. Your health care provider may recommend a prescription medicine to help prevent HIV infection. If you choose to take medicine to prevent HIV, you should first get tested for HIV. You should then be tested every 3 months for as long as you are taking the medicine. Pregnancy  If you are about to stop having your period (premenopausal) and  you may become pregnant, seek counseling before you get pregnant.  Take 400 to 800 micrograms (mcg) of folic acid every day if you become pregnant.  Ask for birth control (contraception) if you want to prevent pregnancy. Osteoporosis and menopause Osteoporosis is a disease in which the bones lose minerals and strength with aging. This can result in bone fractures. If you are 65 years old or older, or if you are at risk for osteoporosis and fractures, ask your health care provider if you should:  Be screened for bone loss.  Take a calcium or vitamin D supplement to lower your risk of fractures.  Be given hormone replacement therapy (HRT) to treat symptoms of menopause. Follow these instructions at home: Lifestyle  Do not use any products that contain nicotine or tobacco, such as cigarettes, e-cigarettes, and chewing tobacco. If you need help quitting, ask your health care provider.  Do not use street drugs.  Do not share needles.  Ask your health care provider for help if you need support or information about quitting drugs. Alcohol use  Do not drink alcohol if: ? Your health care provider tells you not to drink. ? You are pregnant, may be pregnant, or are planning to become pregnant.  If you drink alcohol: ? Limit how much you use to 0-1 drink a day. ? Limit intake if you are breastfeeding.  Be aware of how much alcohol is in your drink. In the [place] one drink equals one 12 oz bottle of beer (355 mL), one 5 oz glass of wine (148 mL), or one 1 oz glass of hard liquor (44 mL). General instructions  Schedule regular health, dental, and eye exams.  Stay current with your vaccines.  Tell your health care provider if: ? You often feel depressed. ? You have ever been abused or do not feel safe at home. Summary  Adopting a healthy lifestyle and getting preventive care are important in promoting health and wellness.  Follow your health care provider's instructions about healthy  diet, exercising, and getting tested or screened for diseases.  Follow your health care provider's instructions on monitoring your cholesterol and blood pressure. This information is not intended to replace advice given to you by your health care provider. Make sure you discuss any questions you have with your health care provider. Document Released: 07/19/2010 Document Revised: 12/27/2017 Document Reviewed: 12/27/2017 Elsevier Patient Education  2020 Elsevier Inc.  

## 2018-11-12 NOTE — Assessment & Plan Note (Signed)
BP at goal on amlodipine 5 mg daily and will continue. Checking CMP and adjust as needed.

## 2018-11-12 NOTE — Assessment & Plan Note (Signed)
Checking lipid panel, she has declined meds in the past.

## 2018-11-12 NOTE — Progress Notes (Signed)
   Subjective:   Patient ID: Joyce Russell, female    DOB: 1970/01/27, 48 y.o.   MRN: [id number]  HPI The patient is a 48 YO female coming in for physical.   PMH, [place], social history reviewed and updated  Review of Systems  Constitutional: Negative.   HENT: Negative.   Eyes: Negative.   Respiratory: Negative for cough, chest tightness and shortness of breath.   Cardiovascular: Negative for chest pain, palpitations and leg swelling.  Gastrointestinal: Negative for abdominal distention, abdominal pain, constipation, diarrhea, nausea and vomiting.  Musculoskeletal: Negative.   Skin: Negative.   Neurological: Negative.   Psychiatric/Behavioral: Negative.     Objective:  Physical Exam Constitutional:      Appearance: She is well-developed.  HENT:     Head: Normocephalic and atraumatic.  Neck:     Musculoskeletal: Normal range of motion.  Cardiovascular:     Rate and Rhythm: Normal rate and regular rhythm.  Pulmonary:     Effort: Pulmonary effort is normal. No respiratory distress.     Breath sounds: Normal breath sounds. No wheezing or rales.  Abdominal:     General: Bowel sounds are normal. There is no distension.     Palpations: Abdomen is soft.     Tenderness: There is no abdominal tenderness. There is no rebound.  Skin:    General: Skin is warm and dry.  Neurological:     Mental Status: She is alert and oriented to person, place, and time.     Coordination: Coordination normal.     Vitals:   11/12/18 1036  BP: 112/86  Pulse: 65  Temp: 97.8 F (36.6 C)  TempSrc: Oral  SpO2: 99%  Weight: 184 lb (83.5 kg)  Height: 5\' 4"  (1.626 m)    Assessment & Plan:  Flu shot given at visit

## 2018-11-12 NOTE — Assessment & Plan Note (Signed)
Flu shot given. Tetanus up to date. Mammogram up to date with gyn, pap smear up to date with gyn. Counseled about sun safety and mole surveillance. Counseled about the dangers of distracted driving. Given 10 year screening recommendations.

## 2018-11-19 ENCOUNTER — Encounter: Payer: 59 | Admitting: Internal Medicine

## 2019-03-20 ENCOUNTER — Other Ambulatory Visit: Payer: Self-pay | Admitting: Internal Medicine

## 2019-03-20 DIAGNOSIS — R030 Elevated blood-pressure reading, without diagnosis of hypertension: Secondary | ICD-10-CM

## 2019-03-21 MED FILL — AMLODIPINE BESYLATE 5 MG TA: 5 | 90 days supply | Qty: 90 | Fill #0

## 2019-04-03 ENCOUNTER — Encounter: Payer: Self-pay | Admitting: Cardiology

## 2019-04-03 ENCOUNTER — Ambulatory Visit (INDEPENDENT_AMBULATORY_CARE_PROVIDER_SITE_OTHER): Payer: 59 | Admitting: Cardiology

## 2019-04-03 ENCOUNTER — Other Ambulatory Visit: Payer: Self-pay

## 2019-04-03 VITALS — BP 148/108 | HR 83 | Ht 64.0 in | Wt 190.0 lb

## 2019-04-03 DIAGNOSIS — Z3200 Encounter for pregnancy test, result unknown: Secondary | ICD-10-CM | POA: Diagnosis not present

## 2019-04-03 DIAGNOSIS — E782 Mixed hyperlipidemia: Secondary | ICD-10-CM

## 2019-04-03 DIAGNOSIS — E663 Overweight: Secondary | ICD-10-CM | POA: Diagnosis not present

## 2019-04-03 DIAGNOSIS — I10 Essential (primary) hypertension: Secondary | ICD-10-CM

## 2019-04-03 DIAGNOSIS — Z79899 Other long term (current) drug therapy: Secondary | ICD-10-CM

## 2019-04-03 DIAGNOSIS — Z1329 Encounter for screening for other suspected endocrine disorder: Secondary | ICD-10-CM

## 2019-04-03 HISTORY — DX: Overweight: E66.3

## 2019-04-03 NOTE — Progress Notes (Signed)
Cardiology Office Note:    Date:  04/03/2019   ID:  [name], DOB 01-Aug-1970, MRN [id number]  PCP:  Hoyt Koch, MD  Cardiologist:  Jenean Lindau, MD   Referring MD: Hoyt Koch, *    ASSESSMENT:    1. Essential hypertension   2. Mixed hyperlipidemia   3. Overweight    PLAN:    In order of problems listed above:  1. Primary prevention stressed with the patient.  Importance of compliance with diet and medication stressed and she vocalized understanding. 2. Essential hypertension: Blood pressure is stable.  She tells me it is fine at home.  She will keep a track of blood pressures at home and send it to me in a week.  Lifestyle modification was emphasized. 3. Mixed dyslipidemia: Lipids were markedly elevated.  We will check them today and if they are elevated I would like to start her on rosuvastatin 10 mg daily if they are similar to the last time.  She is agreeable.  Diet was emphasized.  I told her to exercise at least 30 to 40 minutes a day at least 5 days a week and she promises to do so. 4. Patient will be seen in follow-up appointment in 2 months or earlier if the patient has any concerns    Medication Adjustments/Labs and Tests Ordered: Current medicines are reviewed at length with the patient today.  Concerns regarding medicines are outlined above.  No orders of the defined types were placed in this encounter.  No orders of the defined types were placed in this encounter.    Chief Complaint  Patient presents with  . Follow-up    1 Year     History of Present Illness:    Joyce Russell is a 49 y.o. female.  Patient has past medical history of essential hypertension dyslipidemia and she is overweight.  She tells me in the past several months she has had a sedentary lifestyle.  She denies any chest pain orthopnea or PND.  Her LDL was markedly elevated.  She was trying diet and she is here and help will have blood work today.  Past Medical  History:  Diagnosis Date  . Chronic tension headache   . Ectopic pregnancy 2012   right  . Essential hypertension 02/16/2015  . Mixed hyperlipidemia 09/01/2016  . Tension headache, chronic   . Uterine leiomyoma 11/06/2018    Past Surgical History:  Procedure Laterality Date  . SALPINGECTOMY  03/2010   R  . WISDOM TOOTH EXTRACTION      Current Medications: Current Meds  Medication Sig  . amLODipine (NORVASC) 5 MG tablet TAKE 1 TABLET BY MOUTH AT BEDTIME.  . butalbital-acetaminophen-caffeine (FIORICET) 50-325-40 MG tablet Take 1-2 tablets by mouth every 6 (six) hours as needed for headache.  Marland Kitchen PARAGARD INTRAUTERINE COPPER IU ParaGard T 380A     Allergies:   Patient has no known allergies.   Social History   Socioeconomic History  . Marital status: Married    Spouse name: Not on file  . Number of children: Not on file  . Years of education: Not on file  . Highest education level: Not on file  Occupational History  . Not on file  Tobacco Use  . Smoking status: Never Smoker  . Smokeless tobacco: Never Used  Substance and Sexual Activity  . Alcohol use: No  . Drug use: No  . Sexual activity: Yes    Birth control/protection: I.U.D., Abstinence  Comment: PARAGARD  Other Topics Concern  . 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 Difficulty of Paying Living Expenses:   Food Insecurity:   . Worried About Charity fundraiser in the Last Year:   . Arboriculturist in the Last Year:   Transportation Needs:   . Film/video editor (Medical):   Marland Kitchen Lack of Transportation (Non-Medical):   Physical Activity:   . Days of Exercise per Week:   . Minutes of Exercise per Session:   Stress:   . Feeling of Stress :   Social Connections:   . Frequency of Communication with Friends and Family:   . Frequency of Social Gatherings with Friends and  Family:   . Attends Religious Services:   . Active Member of Clubs or Organizations:   . Attends Archivist Meetings:   Marland Kitchen Marital Status:      Family History: The patient's family history includes Cancer in her paternal grandmother; Diabetes in her father, maternal grandfather, and paternal aunt; Glaucoma in her father and mother; Heart disease in her sister; Hypertension in her maternal grandfather, mother, and sister; Stroke (age of onset: 53) in her maternal grandfather.  ROS:   Please see the history of present illness.    All other systems reviewed and are negative.  EKGs/Labs/Other Studies Reviewed:    The following studies were reviewed today: I discussed my findings with the patient at length   Recent Labs: 11/12/2018: ALT 26; BUN 15; Creatinine, Ser 0.73; Hemoglobin 12.7; Platelets 312.0; Potassium 3.9; Sodium 139  Recent Lipid Panel    Component Value Date/Time   CHOL 268 (H) 11/12/2018 1056   CHOL 251 (H) 06/26/2017 0935   TRIG 87.0 11/12/2018 1056   HDL 42.70 11/12/2018 1056   HDL 53 06/26/2017 0935   CHOLHDL 6 11/12/2018 1056   VLDL 17.4 11/12/2018 1056   LDLCALC 208 (H) 11/12/2018 1056   LDLCALC 184 (H) 06/26/2017 0935   LDLDIRECT 197.5 10/12/2012 1011    Physical Exam:    VS:  BP (!) 148/108   Pulse 83   Ht 5\' 4"  (1.626 m)   Wt 190 lb (86.2 kg)   SpO2 96%   BMI 32.61 kg/m     Wt Readings from Last 3 Encounters:  04/03/19 190 lb (86.2 kg)  11/12/18 184 lb (83.5 kg)  09/19/17 167 lb (75.8 kg)     GEN: Patient is in no acute distress HEENT: Normal NECK: No JVD; No carotid bruits LYMPHATICS: No lymphadenopathy CARDIAC: Hear sounds regular, 2/6 systolic murmur at the apex. RESPIRATORY:  Clear to auscultation without rales, wheezing or rhonchi  ABDOMEN: Soft, non-tender, non-distended MUSCULOSKELETAL:  No edema; No deformity  SKIN: Warm and dry NEUROLOGIC:  Alert and oriented x 3 PSYCHIATRIC:  Normal affect   Signed, Jenean Lindau, MD  04/03/2019 9:05 AM    [place]

## 2019-04-03 NOTE — Patient Instructions (Signed)
Medication Instructions:  No medication changes *If you need a refill on your cardiac medications before your next appointment, please call your pharmacy*   Lab Work: You had labs done today. If you have labs (blood work) drawn today and your tests are completely normal, you will receive your results only by: Marland Kitchen MyChart Message (if you have MyChart) OR . A paper copy in the mail If you have any lab test that is abnormal or we need to change your treatment, we will call you to review the results.   Testing/Procedures:  We will order CT coronary calcium score  $150  Please call [phone] to schedule    [facility]  [address], [place] [address]   Follow-Up: At [facility], you and your health needs are our priority.  As part of our continuing mission to provide you with exceptional heart care, we have created designated Provider Care Teams.  These Care Teams include your primary Cardiologist (physician) and Advanced Practice Providers (APPs -  Physician Assistants and Nurse Practitioners) who all work together to provide you with the care you need, when you need it.  We recommend signing up for the patient portal called "MyChart".  Sign up information is provided on this After Visit Summary.  MyChart is used to connect with patients for Virtual Visits (Telemedicine).  Patients are able to view lab/test results, encounter notes, upcoming appointments, etc.  Non-urgent messages can be sent to your provider as well.   To learn more about what you can do with MyChart, go to [email or web address].    Your next appointment:   2 month(s)  The format for your next appointment:   In Person  Provider:   Jyl Heinz, MD   Other Instructions NA

## 2019-04-04 LAB — LIPID PANEL
Chol/HDL Ratio: 5.3 ratio — ABNORMAL HIGH (ref 0.0–4.4)
Cholesterol, Total: 276 mg/dL — ABNORMAL HIGH (ref 100–199)
HDL: 52 mg/dL (ref >39)
LDL Chol Calc (NIH): 210 mg/dL — ABNORMAL HIGH (ref 0–99)
Triglycerides: 82 mg/dL (ref 0–149)
VLDL Cholesterol Cal: 14 mg/dL (ref 5–40)

## 2019-04-04 LAB — BASIC METABOLIC PANEL
BUN/Creatinine Ratio: 23 (ref 9–23)
BUN: 17 mg/dL (ref 6–24)
CO2: 24 mmol/L (ref 20–29)
Calcium: 9.3 mg/dL (ref 8.7–10.2)
Chloride: 101 mmol/L (ref 96–106)
Creatinine, Ser: 0.75 mg/dL (ref 0.57–1.00)
GFR calc Af Amer: 110 mL/min/{1.73_m2} (ref >59)
GFR calc non Af Amer: 95 mL/min/{1.73_m2} (ref >59)
Glucose: 88 mg/dL (ref 65–99)
Potassium: 4.7 mmol/L (ref 3.5–5.2)
Sodium: 138 mmol/L (ref 134–144)

## 2019-04-04 LAB — CBC WITH DIFFERENTIAL/PLATELET
Basophils Absolute: 0 10*3/uL (ref 0.0–0.2)
Basos: 1 %
EOS (ABSOLUTE): 0.1 10*3/uL (ref 0.0–0.4)
Eos: 1 %
Hematocrit: 42 % (ref 34.0–46.6)
Hemoglobin: 13.6 g/dL (ref 11.1–15.9)
Immature Grans (Abs): 0 10*3/uL (ref 0.0–0.1)
Immature Granulocytes: 0 %
Lymphocytes Absolute: 1.5 10*3/uL (ref 0.7–3.1)
Lymphs: 30 %
MCH: 25.4 pg — ABNORMAL LOW (ref 26.6–33.0)
MCHC: 32.4 g/dL (ref 31.5–35.7)
MCV: 78 fL — ABNORMAL LOW (ref 79–97)
Monocytes Absolute: 0.4 10*3/uL (ref 0.1–0.9)
Monocytes: 7 %
Neutrophils Absolute: 3.2 10*3/uL (ref 1.4–7.0)
Neutrophils: 61 %
Platelets: 300 10*3/uL (ref 150–450)
RBC: 5.36 x10E6/uL — ABNORMAL HIGH (ref 3.77–5.28)
RDW: 15.9 % — ABNORMAL HIGH (ref 11.7–15.4)
WBC: 5.2 10*3/uL (ref 3.4–10.8)

## 2019-04-04 LAB — HEPATIC FUNCTION PANEL
ALT: 21 IU/L (ref 0–32)
AST: 19 IU/L (ref 0–40)
Albumin: 4.2 g/dL (ref 3.8–4.8)
Alkaline Phosphatase: 72 IU/L (ref 39–117)
Bilirubin Total: 0.2 mg/dL (ref 0.0–1.2)
Bilirubin, Direct: 0.08 mg/dL (ref 0.00–0.40)
Total Protein: 8.3 g/dL (ref 6.0–8.5)

## 2019-04-04 LAB — TSH: TSH: 1.17 u[IU]/mL (ref 0.450–4.500)

## 2019-04-04 MED ORDER — ROSUVASTATIN CALCIUM 10 MG PO TABS: 10.0000 mg | ORAL_TABLET | Freq: Every day | ORAL | 3 refills | Status: DC

## 2019-04-04 MED FILL — ROSUVASTATIN CALCIUM 10 MG: 10 | 90 days supply | Qty: 90 | Fill #0

## 2019-04-04 NOTE — Addendum Note (Signed)
Addended by: Truddie Hidden on: 04/04/2019 11:18 AM   Modules accepted: Orders

## 2019-04-24 ENCOUNTER — Other Ambulatory Visit: Payer: 59

## 2019-04-25 ENCOUNTER — Ambulatory Visit (INDEPENDENT_AMBULATORY_CARE_PROVIDER_SITE_OTHER)
Admission: RE | Admit: 2019-04-25 | Discharge: 2019-04-25 | Disposition: A | Discharge status: Home / Self Care | Payer: Self-pay | Source: Ambulatory Visit | Attending: Cardiology | Admitting: Cardiology

## 2019-04-25 ENCOUNTER — Other Ambulatory Visit: Payer: Self-pay

## 2019-04-25 DIAGNOSIS — I10 Essential (primary) hypertension: Secondary | ICD-10-CM

## 2019-05-16 ENCOUNTER — Telehealth: Payer: Self-pay | Admitting: Cardiology

## 2019-05-16 NOTE — Telephone Encounter (Signed)
Pt is aware to stop the Crestor and let us know in 2 weeks how she is feeling. Pt verbalized recommendations and had no additional questions.

## 2019-05-16 NOTE — Telephone Encounter (Signed)
Pt c/o swelling: STAT is pt has developed SOB within 24 hours  1) How much weight have you gained and in what time span? No   2) If swelling, where is the swelling located? Leg and ankle   3) Are you currently taking a fluid pill? No   4) Are you currently SOB? No   5) Do you have a log of your daily weights (if so, list)? no  6) Have you gained 3 pounds in a day or 5 pounds in a week? No   7) Have you traveled recently? No patient is having some burning pain in he back of legs when she is walking.

## 2019-05-16 NOTE — Telephone Encounter (Signed)
Pt states that she began having intermittent swelling in her legs approximately 2 weeks after starting Crestor. Denies weight gain. Pt states that she now has pain in bilateral calf muscles that burn with walking. Denies increased in shortness of breath.

## 2019-05-16 NOTE — Telephone Encounter (Signed)
Stop Crestor and let us know in 2 weeks how she is feeling.

## 2019-05-22 DIAGNOSIS — E782 Mixed hyperlipidemia: Secondary | ICD-10-CM | POA: Diagnosis not present

## 2019-05-23 LAB — HEPATIC FUNCTION PANEL
ALT: 18 IU/L (ref 0–32)
AST: 19 IU/L (ref 0–40)
Albumin: 4.3 g/dL (ref 3.8–4.8)
Alkaline Phosphatase: 66 IU/L (ref 39–117)
Bilirubin Total: 0.3 mg/dL (ref 0.0–1.2)
Bilirubin, Direct: 0.09 mg/dL (ref 0.00–0.40)
Total Protein: 7.8 g/dL (ref 6.0–8.5)

## 2019-05-23 LAB — LIPID PANEL
Chol/HDL Ratio: 4.9 ratio — ABNORMAL HIGH (ref 0.0–4.4)
Cholesterol, Total: 205 mg/dL — ABNORMAL HIGH (ref 100–199)
HDL: 42 mg/dL (ref >39)
LDL Chol Calc (NIH): 146 mg/dL — ABNORMAL HIGH (ref 0–99)
Triglycerides: 96 mg/dL (ref 0–149)
VLDL Cholesterol Cal: 17 mg/dL (ref 5–40)

## 2019-05-23 MED ORDER — LIVALO 1 MG PO TABS: 1.0000 mg | ORAL_TABLET | Freq: Every day | ORAL | 3 refills | Status: DC

## 2019-05-23 NOTE — Addendum Note (Signed)
Addended by: Truddie Hidden on: 05/23/2019 02:03 PM   Modules accepted: Orders

## 2019-05-24 ENCOUNTER — Telehealth: Payer: Self-pay

## 2019-05-27 NOTE — Telephone Encounter (Signed)
Message sent to pt on MyChart 

## 2019-05-29 MED ORDER — LIVALO 1 MG PO TABS: 1.0000 mg | ORAL_TABLET | Freq: Every day | ORAL | 3 refills | Status: DC

## 2019-05-29 NOTE — Addendum Note (Signed)
Addended by: Truddie Hidden on: 05/29/2019 01:35 PM   Modules accepted: Orders

## 2019-05-29 NOTE — Addendum Note (Signed)
Addended by: Truddie Hidden on: 05/29/2019 01:37 PM   Modules accepted: Orders

## 2019-06-13 ENCOUNTER — Ambulatory Visit: Payer: 59 | Admitting: Cardiology

## 2019-07-01 MED FILL — AMLODIPINE BESYLATE 5 MG TA: 5 | 90 days supply | Qty: 90 | Fill #1

## 2019-07-01 MED FILL — LIVALO 1 MG TABLET: 1 | 30 days supply | Qty: 30 | Fill #1

## 2019-07-03 MED FILL — MEFLOQUINE HCL 250 MG TAB: 250 | 42 days supply | Qty: 8 | Fill #0

## 2019-07-04 MED FILL — AZITHROMYCIN 500 MG TABS: 500 | 3 days supply | Qty: 4 | Fill #0

## 2019-07-09 ENCOUNTER — Other Ambulatory Visit: Payer: Self-pay | Admitting: Internal Medicine

## 2019-07-09 MED ORDER — BUTALBITAL-APAP-CAFFEINE 50-325-40 MG PO TABS: 1.0000 | ORAL_TABLET | Freq: Four times a day (QID) | ORAL | 1 refills | Status: DC | PRN

## 2019-07-09 MED FILL — BUTALB-ACETAMIN-CAFF 50-325: 50-325-40 | 3 days supply | Qty: 30 | Fill #0

## 2019-07-10 ENCOUNTER — Ambulatory Visit: Payer: 59 | Admitting: Cardiology

## 2019-07-17 DIAGNOSIS — U071 COVID-19: Secondary | ICD-10-CM | POA: Diagnosis not present

## 2019-07-18 DIAGNOSIS — Z03818 Encounter for observation for suspected exposure to other biological agents ruled out: Secondary | ICD-10-CM | POA: Diagnosis not present

## 2019-07-19 ENCOUNTER — Ambulatory Visit: Payer: 59 | Admitting: Cardiology

## 2019-08-07 MED FILL — LIVALO 1 MG TABLET: 1 | 30 days supply | Qty: 30 | Fill #2

## 2019-08-16 ENCOUNTER — Other Ambulatory Visit: Payer: Self-pay

## 2019-08-16 ENCOUNTER — Encounter: Payer: Self-pay | Admitting: Cardiology

## 2019-08-16 ENCOUNTER — Ambulatory Visit (INDEPENDENT_AMBULATORY_CARE_PROVIDER_SITE_OTHER): Payer: 59 | Admitting: Cardiology

## 2019-08-16 VITALS — BP 142/92 | HR 80 | Ht 64.0 in | Wt 194.1 lb

## 2019-08-16 DIAGNOSIS — E663 Overweight: Secondary | ICD-10-CM | POA: Diagnosis not present

## 2019-08-16 DIAGNOSIS — E782 Mixed hyperlipidemia: Secondary | ICD-10-CM

## 2019-08-16 DIAGNOSIS — I10 Essential (primary) hypertension: Secondary | ICD-10-CM

## 2019-08-16 NOTE — Patient Instructions (Signed)
Medication Instructions:  Your physician recommends that you continue on your current medications as directed. Please refer to the Current Medication list given to you today.  *If you need a refill on your cardiac medications before your next appointment, please call your pharmacy*   Lab Work: TODAY:  BMET, TSH, LIPID, & LFT'S  If you have labs (blood work) drawn today and your tests are completely normal, you will receive your results only by:  [place] (if you have MyChart) OR  A paper copy in the mail If you have any lab test that is abnormal or we need to change your treatment, we will call you to review the results.   Testing/Procedures: None ordered   Follow-Up: At [facility], you and your health needs are our priority.  As part of our continuing mission to provide you with exceptional heart care, we have created designated Provider Care Teams.  These Care Teams include your primary Cardiologist (physician) and Advanced Practice Providers (APPs -  Physician Assistants and Nurse Practitioners) who all work together to provide you with the care you need, when you need it.  We recommend signing up for the patient portal called "MyChart".  Sign up information is provided on this After Visit Summary.  MyChart is used to connect with patients for Virtual Visits (Telemedicine).  Patients are able to view lab/test results, encounter notes, upcoming appointments, etc.  Non-urgent messages can be sent to your provider as well.   To learn more about what you can do with MyChart, go to [email or web address].    Your next appointment:   6 month(s)  The format for your next appointment:   In Person  Provider:   Jyl Heinz, MD   Other Instructions

## 2019-08-16 NOTE — Progress Notes (Signed)
Cardiology Office Note:    Date:  08/16/2019   ID:  Joyce Russell, DOB 1970/09/08, MRN [id number]  PCP:  Hoyt Koch, MD  Cardiologist:  Jenean Lindau, MD   Referring MD: Hoyt Koch, *    ASSESSMENT:    1. Essential hypertension   2. Mixed hyperlipidemia   3. Overweight    PLAN:    In order of problems listed above:  1. Primary prevention stressed with the patient.  Importance of compliance with diet medication stressed and she vocalized understanding. 2. Essential hypertension: Blood pressure is elevated but her readings are about 120/80 at home and she keeps a track of them regularly.  I reemphasized dietary issues such as salt intake 3. Mixed dyslipidemia: Diet was emphasized.  She is fasting and will have blood work today. 4. Overweight: Diet was emphasized.  She has a sedentary job and lifestyle I told her to walk at least half an hour a day 5 days a week and she promises to do so.  Weight reduction was stressed.Patient will be seen in follow-up appointment in 6 months or earlier if the patient has any concerns    Medication Adjustments/Labs and Tests Ordered: Current medicines are reviewed at length with the patient today.  Concerns regarding medicines are outlined above.  No orders of the defined types were placed in this encounter.  No orders of the defined types were placed in this encounter.    No chief complaint on file.    History of Present Illness:    Joyce Russell is a 48 y.o. female.  Patient has past medical history of essential hypertension dyslipidemia and is overweight.  She denies any problems at this time and takes care of activities of daily living.  No chest pain orthopnea or PND.  At the time of my evaluation, the patient is alert awake oriented and in no distress.  Past Medical History:  Diagnosis Date  . Chronic tension headache   . Ectopic pregnancy 2012   right  . Essential hypertension 02/16/2015  . Mixed  hyperlipidemia 09/01/2016  . Tension headache, chronic   . Uterine leiomyoma 11/06/2018    Past Surgical History:  Procedure Laterality Date  . SALPINGECTOMY  03/2010   R  . WISDOM TOOTH EXTRACTION      Current Medications: Current Meds  Medication Sig  . amLODipine (NORVASC) 5 MG tablet TAKE 1 TABLET BY MOUTH AT BEDTIME.  . butalbital-acetaminophen-caffeine (FIORICET) 50-325-40 MG tablet Take 1-2 tablets by mouth every 6 (six) hours as needed for headache.  . ibuprofen (ADVIL) 400 MG tablet Take 400 mg by mouth every 6 (six) hours as needed.  . mefloquine (LARIAM) 250 MG tablet Take by mouth.  1 TABLET BY MOUTH ONCE A WEEK START 2 WEEKS BEFORE TRAVEL AND CONTINUE FOR 4 WEEKS AFTER TRAVEL FOR PREVENTION OF MALARIA  . PARAGARD INTRAUTERINE COPPER IU ParaGard T 380A  . Pitavastatin Calcium (LIVALO) 1 MG TABS Take 1 tablet (1 mg total) by mouth daily.     Allergies:   Patient has no known allergies.   Social History   Socioeconomic History  . Marital status: Married    Spouse name: Not on file  . Number of children: Not on file  . Years of education: Not on file  . Highest education level: Not on file  Occupational History  . Not on file  Tobacco Use  . Smoking status: Never Smoker  . Smokeless tobacco: Never Used  Vaping Use  .  Vaping Use: Never used  Substance and Sexual Activity  . Alcohol use: No  . Drug use: No  . Sexual activity: Yes    Birth control/protection: I.U.D., Abstinence    Comment: PARAGARD  Other Topics Concern  . 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 Difficulty of Paying Living Expenses:   Food Insecurity:   . Worried About Charity fundraiser in the Last Year:   . Arboriculturist in the Last Year:   Transportation Needs:   . Film/video editor (Medical):   Marland Kitchen Lack of Transportation (Non-Medical):    Physical Activity:   . Days of Exercise per Week:   . Minutes of Exercise per Session:   Stress:   . Feeling of Stress :   Social Connections:   . Frequency of Communication with Friends and Family:   . Frequency of Social Gatherings with Friends and Family:   . Attends Religious Services:   . Active Member of Clubs or Organizations:   . Attends Archivist Meetings:   Marland Kitchen Marital Status:      Family History: The patient's family history includes Cancer in her paternal grandmother; Diabetes in her father, maternal grandfather, and paternal aunt; Glaucoma in her father and mother; Heart disease in her sister; Hypertension in her maternal grandfather, mother, and sister; Stroke (age of onset: 73) in her maternal grandfather.  ROS:   Please see the history of present illness.    All other systems reviewed and are negative.  EKGs/Labs/Other Studies Reviewed:    The following studies were reviewed today: I discussed my findings with the patient at length.  Her calcium score was 0.   Recent Labs: 04/03/2019: BUN 17; Creatinine, Ser 0.75; Hemoglobin 13.6; Platelets 300; Potassium 4.7; Sodium 138; TSH 1.170 05/22/2019: ALT 18  Recent Lipid Panel    Component Value Date/Time   CHOL 205 (H) 05/22/2019 0853   TRIG 96 05/22/2019 0853   HDL 42 05/22/2019 0853   CHOLHDL 4.9 (H) 05/22/2019 0853   CHOLHDL 6 11/12/2018 1056   VLDL 17.4 11/12/2018 1056   LDLCALC 146 (H) 05/22/2019 0853   LDLDIRECT 197.5 10/12/2012 1011    Physical Exam:    VS:  BP (!) 142/92   Pulse 80   Ht 5\' 4"  (1.626 m)   Wt 194 lb 1.3 oz (88 kg)   SpO2 98%   BMI 33.31 kg/m     Wt Readings from Last 3 Encounters:  08/16/19 194 lb 1.3 oz (88 kg)  04/03/19 190 lb (86.2 kg)  11/12/18 184 lb (83.5 kg)     GEN: Patient is in no acute distress HEENT: Normal NECK: No JVD; No carotid bruits LYMPHATICS: No lymphadenopathy CARDIAC: Hear sounds regular, 2/6 systolic murmur at the apex. RESPIRATORY:  Clear  to auscultation without rales, wheezing or rhonchi  ABDOMEN: Soft, non-tender, non-distended MUSCULOSKELETAL:  No edema; No deformity  SKIN: Warm and dry NEUROLOGIC:  Alert and oriented x 3 PSYCHIATRIC:  Normal affect   Signed, Jenean Lindau, MD  08/16/2019 8:32 AM    [facility]

## 2019-08-17 LAB — BASIC METABOLIC PANEL
BUN/Creatinine Ratio: 15 (ref 9–23)
BUN: 11 mg/dL (ref 6–24)
CO2: 24 mmol/L (ref 20–29)
Calcium: 8.6 mg/dL — ABNORMAL LOW (ref 8.7–10.2)
Chloride: 101 mmol/L (ref 96–106)
Creatinine, Ser: 0.74 mg/dL (ref 0.57–1.00)
GFR calc Af Amer: 112 mL/min/{1.73_m2} (ref >59)
GFR calc non Af Amer: 97 mL/min/{1.73_m2} (ref >59)
Glucose: 82 mg/dL (ref 65–99)
Potassium: 4.3 mmol/L (ref 3.5–5.2)
Sodium: 137 mmol/L (ref 134–144)

## 2019-08-17 LAB — LIPID PANEL
Chol/HDL Ratio: 4.5 ratio — ABNORMAL HIGH (ref 0.0–4.4)
Cholesterol, Total: 183 mg/dL (ref 100–199)
HDL: 41 mg/dL (ref >39)
LDL Chol Calc (NIH): 123 mg/dL — ABNORMAL HIGH (ref 0–99)
Triglycerides: 104 mg/dL (ref 0–149)
VLDL Cholesterol Cal: 19 mg/dL (ref 5–40)

## 2019-08-17 LAB — HEPATIC FUNCTION PANEL
ALT: 30 IU/L (ref 0–32)
AST: 22 IU/L (ref 0–40)
Albumin: 4.1 g/dL (ref 3.8–4.8)
Alkaline Phosphatase: 77 IU/L (ref 48–121)
Bilirubin Total: 0.2 mg/dL (ref 0.0–1.2)
Bilirubin, Direct: 0.09 mg/dL (ref 0.00–0.40)
Total Protein: 7.8 g/dL (ref 6.0–8.5)

## 2019-08-17 LAB — TSH: TSH: 3.31 u[IU]/mL (ref 0.450–4.500)

## 2019-08-22 DIAGNOSIS — Z124 Encounter for screening for malignant neoplasm of cervix: Secondary | ICD-10-CM | POA: Diagnosis not present

## 2019-08-22 DIAGNOSIS — Z6839 Body mass index (BMI) 39.0-39.9, adult: Secondary | ICD-10-CM | POA: Diagnosis not present

## 2019-08-22 DIAGNOSIS — Z1231 Encounter for screening mammogram for malignant neoplasm of breast: Secondary | ICD-10-CM | POA: Diagnosis not present

## 2019-08-22 DIAGNOSIS — Z1211 Encounter for screening for malignant neoplasm of colon: Secondary | ICD-10-CM | POA: Diagnosis not present

## 2019-08-22 DIAGNOSIS — Z01419 Encounter for gynecological examination (general) (routine) without abnormal findings: Secondary | ICD-10-CM | POA: Diagnosis not present

## 2019-08-22 DIAGNOSIS — Z3040 Encounter for surveillance of contraceptives, unspecified: Secondary | ICD-10-CM | POA: Diagnosis not present

## 2019-09-06 MED FILL — LIVALO 1 MG TABLET: 1 | 30 days supply | Qty: 30 | Fill #3

## 2019-10-07 ENCOUNTER — Other Ambulatory Visit: Payer: Self-pay | Admitting: Cardiology

## 2019-10-07 MED FILL — AMLODIPINE BESYLATE 5 MG TA: 5 | 90 days supply | Qty: 90 | Fill #2

## 2019-10-08 ENCOUNTER — Other Ambulatory Visit: Payer: Self-pay | Admitting: Cardiology

## 2019-10-08 MED FILL — LIVALO 1 MG TABLET: 1 | 30 days supply | Qty: 30 | Fill #0

## 2019-11-07 DIAGNOSIS — H524 Presbyopia: Secondary | ICD-10-CM | POA: Diagnosis not present

## 2019-11-07 MED FILL — LIVALO 1 MG TABLET: 1 | 30 days supply | Qty: 30 | Fill #1

## 2019-11-16 ENCOUNTER — Ambulatory Visit (INDEPENDENT_AMBULATORY_CARE_PROVIDER_SITE_OTHER): Payer: 59

## 2019-11-16 DIAGNOSIS — Z23 Encounter for immunization: Secondary | ICD-10-CM

## 2019-12-09 MED FILL — LIVALO 1 MG TABLET: 1 | 30 days supply | Qty: 30 | Fill #2

## 2020-01-13 MED FILL — LIVALO 1 MG TABLET: 1 | 30 days supply | Qty: 30 | Fill #3

## 2020-01-13 MED FILL — AMLODIPINE BESYLATE 5 MG TA: 5 | 90 days supply | Qty: 90 | Fill #3

## 2020-02-17 ENCOUNTER — Other Ambulatory Visit: Payer: Self-pay | Admitting: Cardiology

## 2020-02-17 MED FILL — LIVALO 1 MG TABLET: 1 | 30 days supply | Qty: 30 | Fill #0

## 2020-03-19 MED FILL — LIVALO 1 MG TABLET: 1 | 30 days supply | Qty: 30 | Fill #1

## 2020-03-30 ENCOUNTER — Other Ambulatory Visit: Payer: Self-pay | Admitting: Cardiology

## 2020-03-30 DIAGNOSIS — Z3200 Encounter for pregnancy test, result unknown: Secondary | ICD-10-CM

## 2020-04-13 IMAGING — CT CT CARDIAC CORONARY ARTERY CALCIUM SCORE
3 series · 14 of 20 positions shown, 15 images · non-contrast
Comparison: Chest radiograph 08/26/2016
COMPARISON: Chest radiograph 08/26/2016

Addendum:
EXAM:
OVER-READ INTERPRETATION  CT CHEST

The following report is an over-read performed by radiologist Dr.
Aparicio Anzalone [REDACTED] on 04/25/2019. This over-read
does not include interpretation of cardiac or coronary anatomy or
pathology. The calcium score interpretation by the cardiologist is
attached.
CLINICAL DATA: Risk stratification
Coronary Calcium Score
TECHNIQUE: The patient was scanned on a Siemens Sensation 16 slice scanner.
Axial non-contrast 3mm slices were carried out through the heart.
The data set was analyzed on a dedicated work station and scored
using the Agatson method.

[Series 2: casc 3.0 bv41 2 bestdiast 64 % · axial · 0.33mm/px · z∈[-136,-64]mm · 4 of 40 slices shown, 5 images]
[im 8/40  vessel]
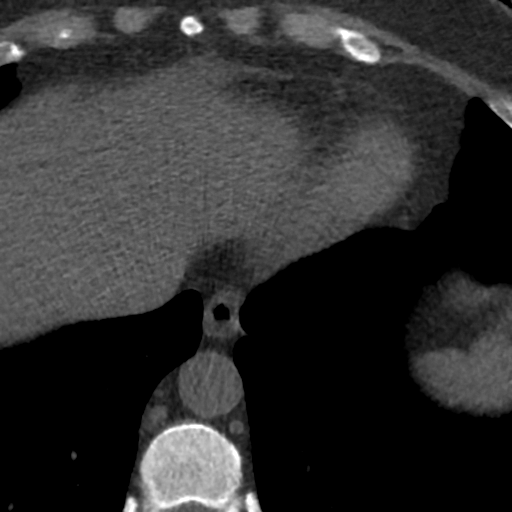
[im 8/40  lung]
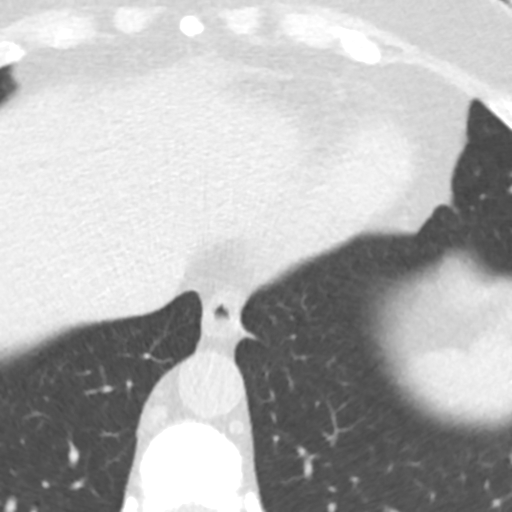
[im 16/40  vessel]
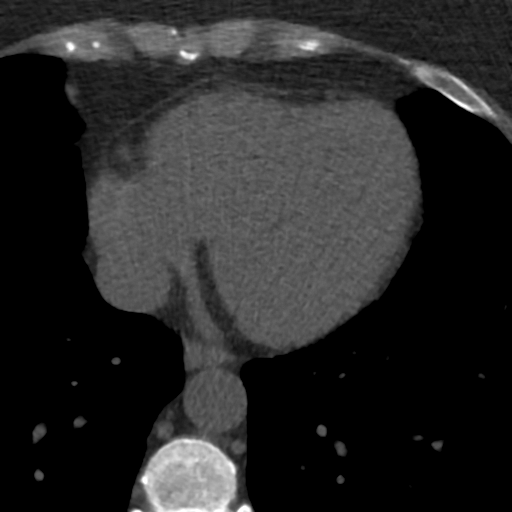
[im 24/40  vessel]
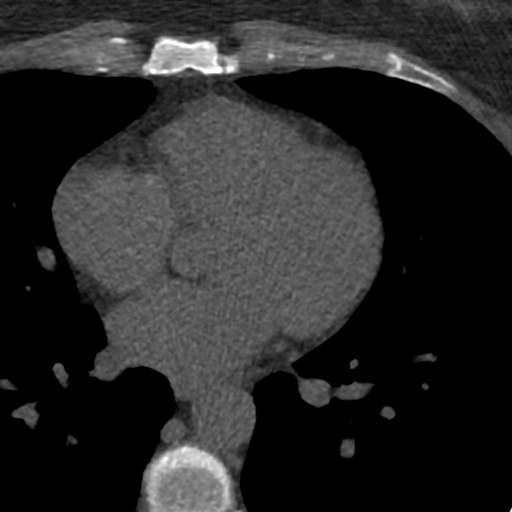
[im 32/40  vessel]
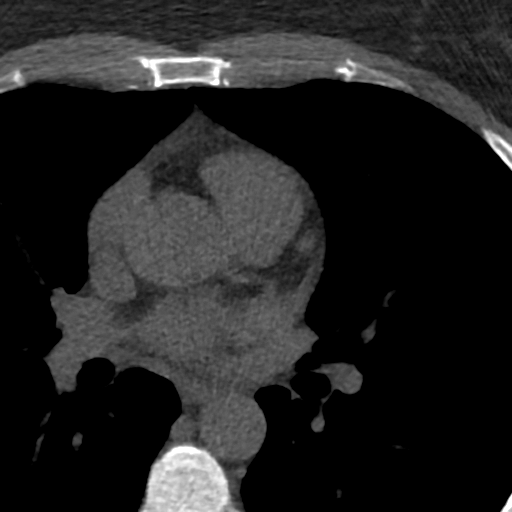

[Series 3: lung 64 % · axial · 0.64mm/px · z∈[-138,-60]mm · 5 of 40 slices shown]
[im 7/40  lung]
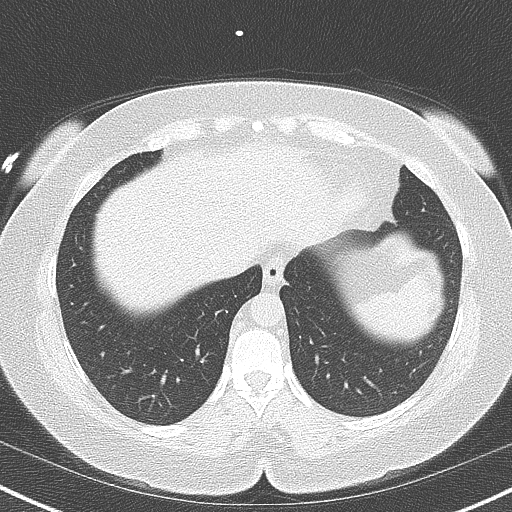
[im 14/40  lung]
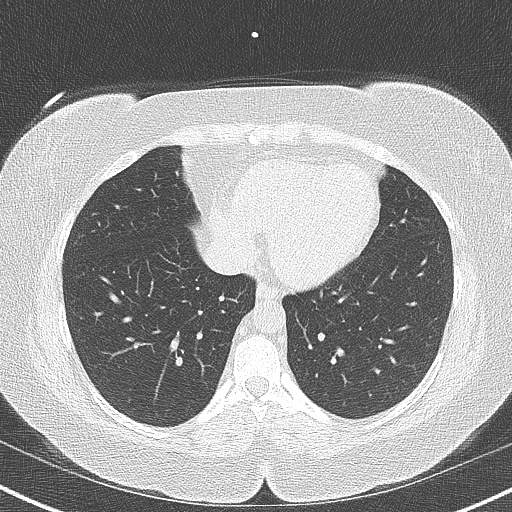
[im 20/40  lung]
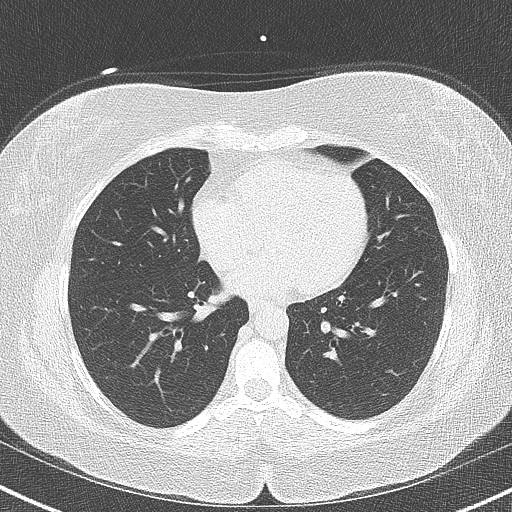
[im 27/40  lung]
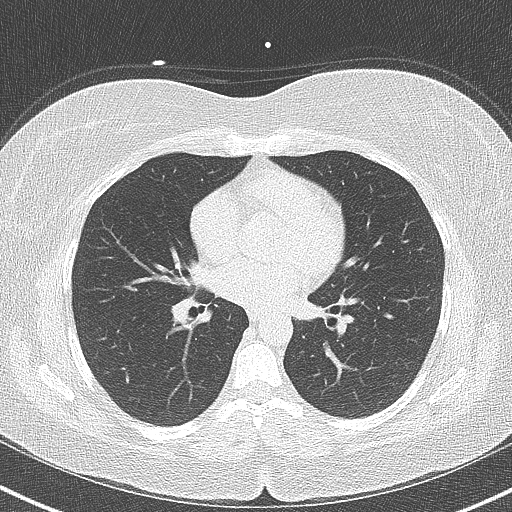
[im 33/40  lung]
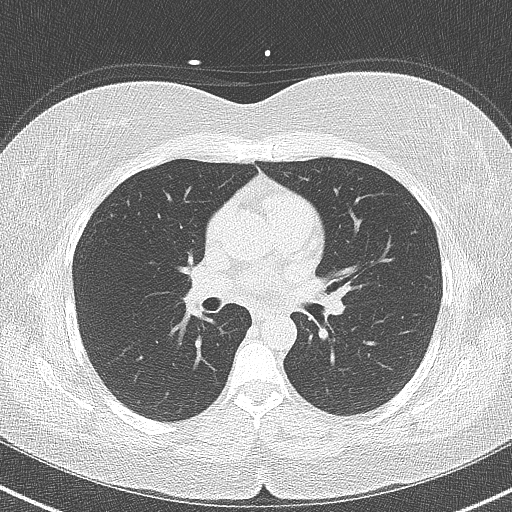

[Series 4: lung st 64 % · axial · 0.64mm/px · z∈[-138,-60]mm · 5 of 40 slices shown]
[im 7/40  lung]
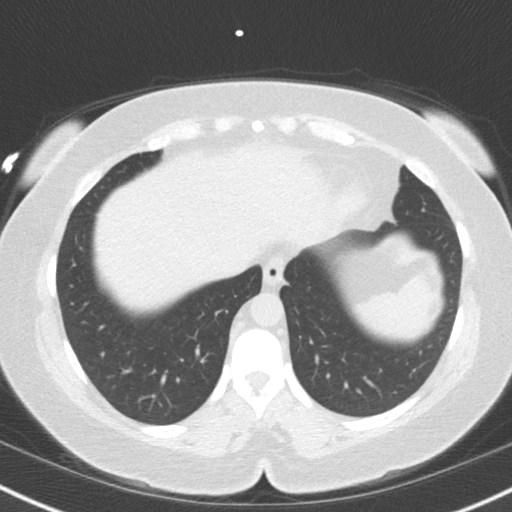
[im 14/40  lung]
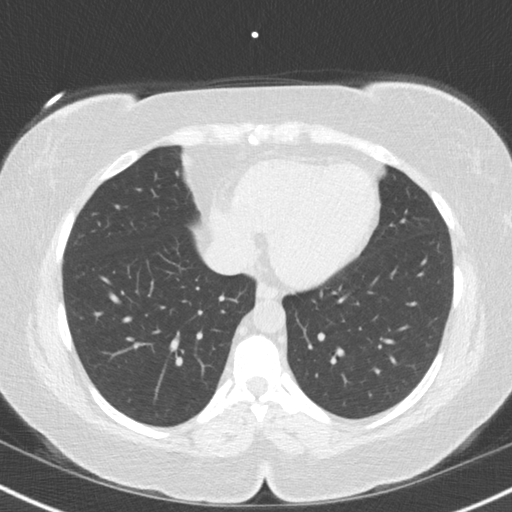
[im 20/40  lung]
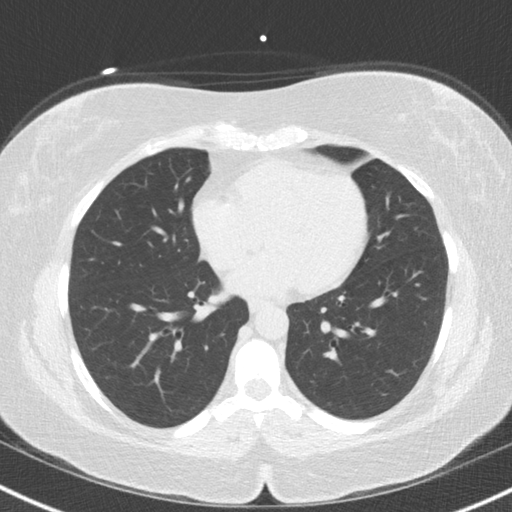
[im 27/40  lung]
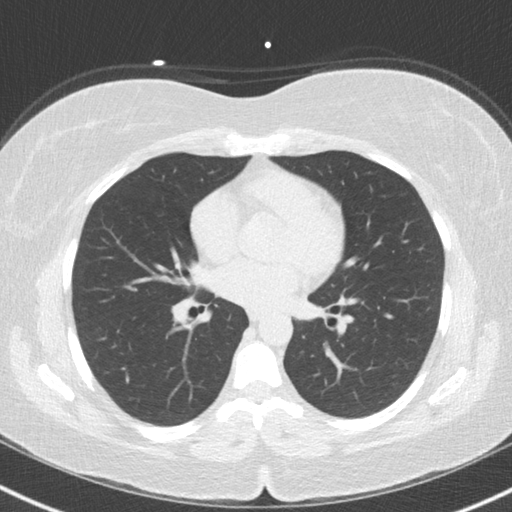
[im 33/40  lung]
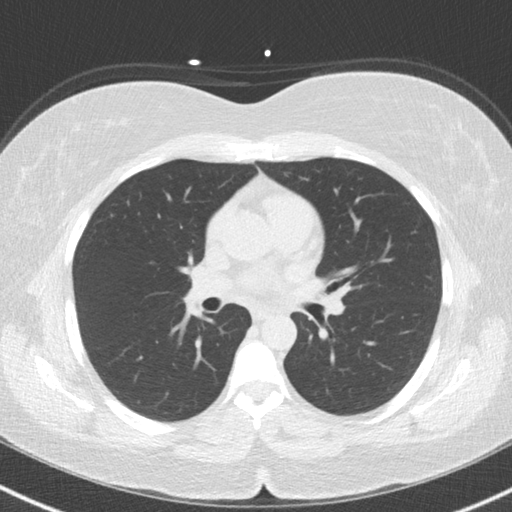

[14 of 20 positions shown; findings below may reference images not displayed]

FINDINGS: Vascular: Normal aortic caliber.

Mediastinum/Nodes: No imaged thoracic adenopathy.

Lungs/Pleura: No pleural fluid.  Clear imaged lungs.

Upper Abdomen: Normal imaged portions of the liver, spleen, stomach.

Musculoskeletal: No acute osseous abnormality.
IMPRESSION: No acute findings in the imaged extracardiac chest.
FINDINGS: Non-cardiac: See separate report from [REDACTED].

Ascending Aorta: Normal caliber

Pericardium: Normal
IMPRESSION: Coronary calcium score of 0. This suggests low risk for future
cardiac events.

Matti Tapio Fager

*** End of Addendum ***
EXAM:
OVER-READ INTERPRETATION  CT CHEST

The following report is an over-read performed by radiologist Dr.
Aparicio Anzalone [REDACTED] on 04/25/2019. This over-read
does not include interpretation of cardiac or coronary anatomy or
pathology. The calcium score interpretation by the cardiologist is
attached.
FINDINGS: Vascular: Normal aortic caliber.

Mediastinum/Nodes: No imaged thoracic adenopathy.

Lungs/Pleura: No pleural fluid.  Clear imaged lungs.

Upper Abdomen: Normal imaged portions of the liver, spleen, stomach.

Musculoskeletal: No acute osseous abnormality.
IMPRESSION: No acute findings in the imaged extracardiac chest.

## 2020-04-16 ENCOUNTER — Telehealth: Payer: Self-pay | Admitting: Internal Medicine

## 2020-04-16 DIAGNOSIS — R030 Elevated blood-pressure reading, without diagnosis of hypertension: Secondary | ICD-10-CM

## 2020-04-18 ENCOUNTER — Other Ambulatory Visit (HOSPITAL_COMMUNITY): Payer: Self-pay

## 2020-04-18 MED FILL — Pitavastatin Calcium Tab 1 MG: ORAL | 30 days supply | Qty: 30 | Fill #0 | Status: AC

## 2020-04-20 ENCOUNTER — Other Ambulatory Visit (HOSPITAL_COMMUNITY): Payer: Self-pay

## 2020-04-20 MED ORDER — AMLODIPINE BESYLATE 5 MG PO TABS
1.0000 | ORAL_TABLET | Freq: Every day | ORAL | 0 refills | Status: DC
  Filled 2020-04-20: qty 30, 30d supply, fill #0

## 2020-04-20 NOTE — Telephone Encounter (Signed)
Patient is requesting a short supply of amLODipine (NORVASC) 5 MG tablet. It can be sent to [facility]. Last OV 11-12-18, Next OV 04-27-20. Please advise

## 2020-04-20 NOTE — Addendum Note (Signed)
Addended by: Thomes Cake on: 04/20/2020 02:09 PM   Modules accepted: Orders

## 2020-04-20 NOTE — Telephone Encounter (Signed)
Short refill has been sent to the pharmacy.

## 2020-04-27 ENCOUNTER — Ambulatory Visit: Payer: 59 | Admitting: Internal Medicine

## 2020-04-29 ENCOUNTER — Other Ambulatory Visit (HOSPITAL_COMMUNITY): Payer: Self-pay

## 2020-05-07 ENCOUNTER — Ambulatory Visit: Payer: 59 | Admitting: Internal Medicine

## 2020-05-11 ENCOUNTER — Other Ambulatory Visit: Payer: Self-pay

## 2020-05-12 ENCOUNTER — Other Ambulatory Visit (HOSPITAL_COMMUNITY): Payer: Self-pay

## 2020-05-12 ENCOUNTER — Other Ambulatory Visit: Payer: Self-pay

## 2020-05-12 ENCOUNTER — Encounter: Payer: Self-pay | Admitting: Internal Medicine

## 2020-05-12 ENCOUNTER — Ambulatory Visit: Payer: 59 | Admitting: Internal Medicine

## 2020-05-12 VITALS — BP 126/62 | HR 80 | Temp 98.5°F | Resp 18 | Ht 64.0 in | Wt 192.0 lb

## 2020-05-12 DIAGNOSIS — Z1211 Encounter for screening for malignant neoplasm of colon: Secondary | ICD-10-CM

## 2020-05-12 DIAGNOSIS — E782 Mixed hyperlipidemia: Secondary | ICD-10-CM | POA: Diagnosis not present

## 2020-05-12 DIAGNOSIS — I10 Essential (primary) hypertension: Secondary | ICD-10-CM

## 2020-05-12 DIAGNOSIS — Z0000 Encounter for general adult medical examination without abnormal findings: Secondary | ICD-10-CM | POA: Diagnosis not present

## 2020-05-12 DIAGNOSIS — R030 Elevated blood-pressure reading, without diagnosis of hypertension: Secondary | ICD-10-CM

## 2020-05-12 LAB — COMPREHENSIVE METABOLIC PANEL
ALT: 34 U/L (ref 0–35)
AST: 27 U/L (ref 0–37)
Albumin: 4 g/dL (ref 3.5–5.2)
Alkaline Phosphatase: 61 U/L (ref 39–117)
BUN: 11 mg/dL (ref 6–23)
CO2: 27 mEq/L (ref 19–32)
Calcium: 9.2 mg/dL (ref 8.4–10.5)
Chloride: 102 mEq/L (ref 96–112)
Creatinine, Ser: 0.75 mg/dL (ref 0.40–1.20)
GFR: 94.31 mL/min (ref >60.00)
Glucose, Bld: 80 mg/dL (ref 70–99)
Potassium: 3.8 mEq/L (ref 3.5–5.1)
Sodium: 136 mEq/L (ref 135–145)
Total Bilirubin: 0.3 mg/dL (ref 0.2–1.2)
Total Protein: 8.2 g/dL (ref 6.0–8.3)

## 2020-05-12 LAB — TSH: TSH: 1.74 u[IU]/mL (ref 0.35–4.50)

## 2020-05-12 LAB — CBC
HCT: 39.8 % (ref 36.0–46.0)
Hemoglobin: 13.2 g/dL (ref 12.0–15.0)
MCHC: 33.1 g/dL (ref 30.0–36.0)
MCV: 77.7 fl — ABNORMAL LOW (ref 78.0–100.0)
Platelets: 289 10*3/uL (ref 150.0–400.0)
RBC: 5.13 Mil/uL — ABNORMAL HIGH (ref 3.87–5.11)
RDW: 16.7 % — ABNORMAL HIGH (ref 11.5–15.5)
WBC: 5.9 10*3/uL (ref 4.0–10.5)

## 2020-05-12 LAB — LIPID PANEL
Cholesterol: 171 mg/dL (ref 0–200)
HDL: 39.6 mg/dL (ref >39.00)
LDL Cholesterol: 118 mg/dL — ABNORMAL HIGH (ref 0–99)
NonHDL: 131.37
Total CHOL/HDL Ratio: 4
Triglycerides: 65 mg/dL (ref 0.0–149.0)
VLDL: 13 mg/dL (ref 0.0–40.0)

## 2020-05-12 LAB — VITAMIN B12: Vitamin B-12: 300 pg/mL (ref 211–911)

## 2020-05-12 LAB — VITAMIN D 25 HYDROXY (VIT D DEFICIENCY, FRACTURES): VITD: 19.69 ng/mL — ABNORMAL LOW (ref 30.00–100.00)

## 2020-05-12 MED ORDER — AMLODIPINE BESYLATE 5 MG PO TABS
1.0000 | ORAL_TABLET | Freq: Every day | ORAL | 3 refills | Status: DC
  Filled 2020-05-12: qty 90, 90d supply, fill #0

## 2020-05-12 NOTE — Progress Notes (Signed)
   Subjective:   Patient ID: Joyce Russell, female    DOB: 08/19/1970, 50 y.o.   MRN: [id number]  HPI The patient is a 50 YO female coming in for physical.   PMH, [place], social history reviewed and updated  Review of Systems  Constitutional: Negative.   HENT: Negative.   Eyes: Negative.   Respiratory: Negative for cough, chest tightness and shortness of breath.   Cardiovascular: Negative for chest pain, palpitations and leg swelling.  Gastrointestinal: Negative for abdominal distention, abdominal pain, constipation, diarrhea, nausea and vomiting.  Musculoskeletal: Negative.   Skin: Negative.   Neurological: Negative.   Psychiatric/Behavioral: Negative.     Objective:  Physical Exam Constitutional:      Appearance: She is well-developed.  HENT:     Head: Normocephalic and atraumatic.  Cardiovascular:     Rate and Rhythm: Normal rate and regular rhythm.  Pulmonary:     Effort: Pulmonary effort is normal. No respiratory distress.     Breath sounds: Normal breath sounds. No wheezing or rales.  Abdominal:     General: Bowel sounds are normal. There is no distension.     Palpations: Abdomen is soft.     Tenderness: There is no abdominal tenderness. There is no rebound.  Musculoskeletal:     Cervical back: Normal range of motion.  Skin:    General: Skin is warm and dry.  Neurological:     Mental Status: She is alert and oriented to person, place, and time.     Coordination: Coordination normal.     Vitals:   05/12/20 0848  BP: 126/62  Pulse: 80  Resp: 18  Temp: 98.5 F (36.9 C)  TempSrc: Oral  SpO2: 97%  Weight: 192 lb (87.1 kg)  Height: 5\' 4"  (1.626 m)    This visit occurred during the SARS-CoV-2 public health emergency.  Safety protocols were in place, including screening questions prior to the visit, additional usage of staff PPE, and extensive cleaning of exam room while observing appropriate contact time as indicated for disinfecting solutions.   Assessment &  Plan:

## 2020-05-12 NOTE — Assessment & Plan Note (Signed)
At goal on amlodipine 5 mg daily. Checking CMP and adjust as needed.

## 2020-05-12 NOTE — Assessment & Plan Note (Signed)
Flu shot up to date. Covid-19 2 shots encouraged booster. Tetanus up to date. Colonoscopy referral to GI placed. Mammogram up to date, pap smear up to date. Counseled about sun safety and mole surveillance. Counseled about the dangers of distracted driving. Given 10 year screening recommendations.

## 2020-05-12 NOTE — Patient Instructions (Addendum)
Vaccines.gov  Health Maintenance, Female Adopting a healthy lifestyle and getting preventive care are important in promoting health and wellness. Ask your health care provider about:  The right schedule for you to have regular tests and exams.  Things you can do on your own to prevent diseases and keep yourself healthy. What should I know about diet, weight, and exercise? Eat a healthy diet  Eat a diet that includes plenty of vegetables, fruits, low-fat dairy products, and lean protein.  Do not eat a lot of foods that are high in solid fats, added sugars, or sodium.   Maintain a healthy weight Body mass index (BMI) is used to identify weight problems. It estimates body fat based on height and weight. Your health care provider can help determine your BMI and help you achieve or maintain a healthy weight. Get regular exercise Get regular exercise. This is one of the most important things you can do for your health. Most adults should:  Exercise for at least 150 minutes each week. The exercise should increase your heart rate and make you sweat (moderate-intensity exercise).  Do strengthening exercises at least twice a week. This is in addition to the moderate-intensity exercise.  Spend less time sitting. Even light physical activity can be beneficial. Watch cholesterol and blood lipids Have your blood tested for lipids and cholesterol at 48 years of age, then have this test every 5 years. Have your cholesterol levels checked more often if:  Your lipid or cholesterol levels are high.  You are older than 48 years of age.  You are at high risk for heart disease. What should I know about cancer screening? Depending on your health history and family history, you may need to have cancer screening at various ages. This may include screening for:  Breast cancer.  Cervical cancer.  Colorectal cancer.  Skin cancer.  Lung cancer. What should I know about heart disease, diabetes, and  high blood pressure? Blood pressure and heart disease  High blood pressure causes heart disease and increases the risk of stroke. This is more likely to develop in people who have high blood pressure readings, are of African descent, or are overweight.  Have your blood pressure checked: ? Every 3-5 years if you are 18-39 years of age. ? Every year if you are 40 years old or older. Diabetes Have regular diabetes screenings. This checks your fasting blood sugar level. Have the screening done:  Once every three years after age 40 if you are at a normal weight and have a low risk for diabetes.  More often and at a younger age if you are overweight or have a high risk for diabetes. What should I know about preventing infection? Hepatitis B If you have a higher risk for hepatitis B, you should be screened for this virus. Talk with your health care provider to find out if you are at risk for hepatitis B infection. Hepatitis C Testing is recommended for:  Everyone born from 1945 through 1965.  Anyone with known risk factors for hepatitis C. Sexually transmitted infections (STIs)  Get screened for STIs, including gonorrhea and chlamydia, if: ? You are sexually active and are younger than 48 years of age. ? You are older than 48 years of age and your health care provider tells you that you are at risk for this type of infection. ? Your sexual activity has changed since you were last screened, and you are at increased risk for chlamydia or gonorrhea. Ask your health   care provider if you are at risk.  Ask your health care provider about whether you are at high risk for HIV. Your health care provider may recommend a prescription medicine to help prevent HIV infection. If you choose to take medicine to prevent HIV, you should first get tested for HIV. You should then be tested every 3 months for as long as you are taking the medicine. Pregnancy  If you are about to stop having your period  (premenopausal) and you may become pregnant, seek counseling before you get pregnant.  Take 400 to 800 micrograms (mcg) of folic acid every day if you become pregnant.  Ask for birth control (contraception) if you want to prevent pregnancy. Osteoporosis and menopause Osteoporosis is a disease in which the bones lose minerals and strength with aging. This can result in bone fractures. If you are 19 years old or older, or if you are at risk for osteoporosis and fractures, ask your health care provider if you should:  Be screened for bone loss.  Take a calcium or vitamin D supplement to lower your risk of fractures.  Be given hormone replacement therapy (HRT) to treat symptoms of menopause. Follow these instructions at home: Lifestyle  Do not use any products that contain nicotine or tobacco, such as cigarettes, e-cigarettes, and chewing tobacco. If you need help quitting, ask your health care provider.  Do not use street drugs.  Do not share needles.  Ask your health care provider for help if you need support or information about quitting drugs. Alcohol use  Do not drink alcohol if: ? Your health care provider tells you not to drink. ? You are pregnant, may be pregnant, or are planning to become pregnant.  If you drink alcohol: ? Limit how much you use to 0-1 drink a day. ? Limit intake if you are breastfeeding.  Be aware of how much alcohol is in your drink. In the [place] one drink equals one 12 oz bottle of beer (355 mL), one 5 oz glass of wine (148 mL), or one 1 oz glass of hard liquor (44 mL). General instructions  Schedule regular health, dental, and eye exams.  Stay current with your vaccines.  Tell your health care provider if: ? You often feel depressed. ? You have ever been abused or do not feel safe at home. Summary  Adopting a healthy lifestyle and getting preventive care are important in promoting health and wellness.  Follow your health care provider's  instructions about healthy diet, exercising, and getting tested or screened for diseases.  Follow your health care provider's instructions on monitoring your cholesterol and blood pressure. This information is not intended to replace advice given to you by your health care provider. Make sure you discuss any questions you have with your health care provider. Document Revised: 12/27/2017 Document Reviewed: 12/27/2017 Elsevier Patient Education  2021 [employer].

## 2020-05-12 NOTE — Assessment & Plan Note (Signed)
Checking lipid panel and taking livalo 1 mg daily, adjust as needed.

## 2020-05-18 ENCOUNTER — Other Ambulatory Visit: Payer: Self-pay | Admitting: Internal Medicine

## 2020-05-18 ENCOUNTER — Other Ambulatory Visit (HOSPITAL_COMMUNITY): Payer: Self-pay

## 2020-05-18 MED ORDER — VITAMIN D (ERGOCALCIFEROL) 1.25 MG (50000 UNIT) PO CAPS
50000.0000 [IU] | ORAL_CAPSULE | ORAL | 0 refills | Status: DC
  Filled 2020-05-18: qty 12, 84d supply, fill #0

## 2020-05-19 ENCOUNTER — Other Ambulatory Visit (HOSPITAL_COMMUNITY): Payer: Self-pay

## 2020-05-19 ENCOUNTER — Telehealth: Payer: Self-pay | Admitting: Internal Medicine

## 2020-05-19 DIAGNOSIS — R030 Elevated blood-pressure reading, without diagnosis of hypertension: Secondary | ICD-10-CM

## 2020-05-19 MED ORDER — VITAMIN D (ERGOCALCIFEROL) 1.25 MG (50000 UNIT) PO CAPS: 50000.0000 [IU] | ORAL_CAPSULE | ORAL | 0 refills | Status: DC

## 2020-05-19 MED ORDER — AMLODIPINE BESYLATE 5 MG PO TABS
1.0000 | ORAL_TABLET | Freq: Every day | ORAL | 3 refills | Status: DC
  Filled 2020-05-19: qty 90, 90d supply, fill #0
  Filled 2020-08-26: qty 90, 90d supply, fill #1
  Filled 2020-11-24: qty 90, 90d supply, fill #2
  Filled 2021-02-26: qty 90, 90d supply, fill #3

## 2020-05-19 NOTE — Telephone Encounter (Signed)
Medication has been re-sent to the patient's pharmacy.

## 2020-05-19 NOTE — Telephone Encounter (Signed)
   Please re-send  amLODipine (NORVASC) 5 MG tablet Vitamin D, Ergocalciferol, (DRISDOL) 1.25 MG (50000 UNIT) CAPS capsule   Pharmacy [facility]

## 2020-05-20 ENCOUNTER — Other Ambulatory Visit: Payer: Self-pay | Admitting: Internal Medicine

## 2020-05-20 ENCOUNTER — Other Ambulatory Visit (HOSPITAL_COMMUNITY): Payer: Self-pay

## 2020-05-20 MED FILL — Pitavastatin Calcium Tab 1 MG: ORAL | 30 days supply | Qty: 30 | Fill #1 | Status: AC

## 2020-05-21 ENCOUNTER — Other Ambulatory Visit (HOSPITAL_COMMUNITY): Payer: Self-pay

## 2020-05-22 ENCOUNTER — Other Ambulatory Visit (HOSPITAL_COMMUNITY): Payer: Self-pay

## 2020-05-22 ENCOUNTER — Other Ambulatory Visit: Payer: Self-pay | Admitting: Internal Medicine

## 2020-05-22 MED ORDER — BUTALBITAL-APAP-CAFFEINE 50-325-40 MG PO TABS
1.0000 | ORAL_TABLET | Freq: Four times a day (QID) | ORAL | 0 refills | Status: DC | PRN
  Filled 2020-05-22: qty 30, 4d supply, fill #0

## 2020-06-16 DIAGNOSIS — G44229 Chronic tension-type headache, not intractable: Secondary | ICD-10-CM | POA: Insufficient documentation

## 2020-06-18 ENCOUNTER — Encounter: Payer: Self-pay | Admitting: Cardiology

## 2020-06-18 ENCOUNTER — Other Ambulatory Visit: Payer: Self-pay

## 2020-06-18 ENCOUNTER — Ambulatory Visit (INDEPENDENT_AMBULATORY_CARE_PROVIDER_SITE_OTHER): Payer: 59 | Admitting: Cardiology

## 2020-06-18 VITALS — BP 138/98 | HR 90 | Ht 64.0 in | Wt 189.0 lb

## 2020-06-18 DIAGNOSIS — E782 Mixed hyperlipidemia: Secondary | ICD-10-CM

## 2020-06-18 DIAGNOSIS — I10 Essential (primary) hypertension: Secondary | ICD-10-CM

## 2020-06-18 DIAGNOSIS — E669 Obesity, unspecified: Secondary | ICD-10-CM | POA: Diagnosis not present

## 2020-06-18 DIAGNOSIS — E66811 Obesity, class 1: Secondary | ICD-10-CM

## 2020-06-18 HISTORY — DX: Obesity, unspecified: E66.9

## 2020-06-18 HISTORY — DX: Obesity, class 1: E66.811

## 2020-06-18 NOTE — Progress Notes (Signed)
Cardiology Office Note:    Date:  06/18/2020   ID:  Joyce Russell, DOB 08-Oct-1970, MRN [id number]  PCP:  Hoyt Koch, MD  Cardiologist:  Jenean Lindau, MD   Referring MD: Hoyt Koch, *    ASSESSMENT:    1. Essential hypertension   2. Mixed hyperlipidemia   3. Obesity (BMI 30.0-34.9)      PLAN:    problems In order of problems listed above:  1. Primary prevention stressed with the patient.  Importance of compliance with diet medication stressed and she vocalized understanding.  Importance of regular exercise stressed. 2. Essential hypertension: Blood pressure stable and diet was emphasized.  She has an element of whitecoat hypertension.  Diet and lifestyle modification urged.  She was advised to walk at least half an hour a day 5 days a week and she promises to do so. 3. Mixed dyslipidemia: Lipids were reviewed diet was emphasized. 4. Obesity: Weight reduction stressed diet and exercise stressed again at this time she plans to take things seriously. 5. Patient will be seen in follow-up appointment in 6 months or earlier if the patient has any concerns    Medication Adjustments/Labs and Tests Ordered: Current medicines are reviewed at length with the patient today.  Concerns regarding medicines are outlined above.  Orders Placed This Encounter  Procedures  . EKG 12-Lead   No orders of the defined types were placed in this encounter.    No chief complaint on file.    History of Present Illness:    Joyce Russell is a 50 y.o. female.  Patient has past medical history of essential hypertension  dyslipidemia and obesity.  She mentions to me that she has been lax with exercise.  No chest pain orthopnea or PND.  At the time of my evaluation, the patient is alert awake oriented and in no distress.  Past Medical History:  Diagnosis Date  . Chronic tension headache   . Ectopic pregnancy 2012   right  . Ectopic pregnancy 2012   right  . Essential  hypertension 02/16/2015  . Mixed hyperlipidemia 09/01/2016  . Overweight 04/03/2019  . Routine general medical examination at a health care facility 02/16/2015  . Tension headache, chronic   . Uterine leiomyoma 11/06/2018    Past Surgical History:  Procedure Laterality Date  . SALPINGECTOMY  03/2010   R  . WISDOM TOOTH EXTRACTION      Current Medications: Current Meds  Medication Sig  . amLODipine (NORVASC) 5 MG tablet Take 1 tablet (5 mg total) by mouth at bedtime.  . butalbital-acetaminophen-caffeine (FIORICET) 50-325-40 MG tablet Take 1-2 tablets by mouth every 6 (six) hours as needed for headache.  . ibuprofen (ADVIL) 400 MG tablet Take 400 mg by mouth every 6 (six) hours as needed for pain.  Marland Kitchen PARAGARD INTRAUTERINE COPPER IU 1 Device by Intrauterine route once.  . Pitavastatin Calcium 1 MG TABS Take 1 mg by mouth daily.  . Vitamin D, Ergocalciferol, (DRISDOL) 1.25 MG (50000 UNIT) CAPS capsule Take 1 capsule (50,000 Units total) by mouth every 7 (seven) days.     Allergies:   Patient has no known allergies.   Social History   Socioeconomic History  . Marital status: Married    Spouse name: Not on file  . Number of children: Not on file  . Years of education: Not on file  . Highest education level: Not on file  Occupational History  . Not on file  Tobacco Use  .  Smoking status: Never Smoker  . Smokeless tobacco: Never Used  Vaping Use  . Vaping Use: Never used  Substance and Sexual Activity  . Alcohol use: No  . Drug use: No  . Sexual activity: Yes    Birth control/protection: I.U.D., Abstinence    Comment: PARAGARD  Other Topics Concern  . Not on file  Social History Narrative   Lives with spouse, 3 children and mother in law   CM @ [facility] hospital   Social Determinants of Health   Financial Resource Strain: Not on file  Food Insecurity: Not on file  Transportation Needs: Not on file  Physical Activity: Not on file  Stress: Not on file  Social Connections: Not  on file     Family History: The patient's family history includes Cancer in her paternal grandmother; Diabetes in her father, maternal grandfather, and paternal aunt; Glaucoma in her father and mother; Heart disease in her sister; Hypertension in her maternal grandfather, mother, and sister; Stroke (age of onset: 73) in her maternal grandfather.  ROS:   Please see the history of present illness.    All other systems reviewed and are negative.  EKGs/Labs/Other Studies Reviewed:    The following studies were reviewed today: Calcium score report was reviewed and I also discussed with her about her lipids.   Recent Labs: 05/12/2020: ALT 34; BUN 11; Creatinine, Ser 0.75; Hemoglobin 13.2; Platelets 289.0; Potassium 3.8; Sodium 136; TSH 1.74  Recent Lipid Panel    Component Value Date/Time   CHOL 171 05/12/2020 0912   CHOL 183 08/16/2019 0859   TRIG 65.0 05/12/2020 0912   HDL 39.60 05/12/2020 0912   HDL 41 08/16/2019 0859   CHOLHDL 4 05/12/2020 0912   VLDL 13.0 05/12/2020 0912   LDLCALC 118 (H) 05/12/2020 0912   LDLCALC 123 (H) 08/16/2019 0859   LDLDIRECT 197.5 10/12/2012 1011    Physical Exam:    VS:  BP (!) 138/98   Pulse 90   Ht 5\' 4"  (1.626 m)   Wt 189 lb 0.6 oz (85.7 kg)   SpO2 98%   BMI 32.45 kg/m     Wt Readings from Last 3 Encounters:  06/18/20 189 lb 0.6 oz (85.7 kg)  05/12/20 192 lb (87.1 kg)  08/16/19 194 lb 1.3 oz (88 kg)     GEN: Patient is in no acute distress HEENT: Normal NECK: No JVD; No carotid bruits LYMPHATICS: No lymphadenopathy CARDIAC: Hear sounds regular, 2/6 systolic murmur at the apex. RESPIRATORY:  Clear to auscultation without rales, wheezing or rhonchi  ABDOMEN: Soft, non-tender, non-distended MUSCULOSKELETAL:  No edema; No deformity  SKIN: Warm and dry NEUROLOGIC:  Alert and oriented x 3 PSYCHIATRIC:  Normal affect   Signed, Jenean Lindau, MD  06/18/2020 9:44 AM    [place]

## 2020-06-18 NOTE — Patient Instructions (Signed)

## 2020-06-23 ENCOUNTER — Other Ambulatory Visit (HOSPITAL_COMMUNITY): Payer: Self-pay

## 2020-06-23 ENCOUNTER — Other Ambulatory Visit: Payer: Self-pay | Admitting: Cardiology

## 2020-06-23 MED ORDER — PITAVASTATIN CALCIUM 1 MG PO TABS
1.0000 mg | ORAL_TABLET | Freq: Every day | ORAL | 3 refills | Status: DC
  Filled 2020-06-23: qty 90, 90d supply, fill #0
  Filled 2020-09-18: qty 90, 90d supply, fill #1
  Filled 2020-12-23: qty 90, 90d supply, fill #2

## 2020-06-26 ENCOUNTER — Other Ambulatory Visit (HOSPITAL_COMMUNITY): Payer: Self-pay

## 2020-07-03 ENCOUNTER — Telehealth: Payer: 59 | Admitting: Physician Assistant

## 2020-07-03 DIAGNOSIS — J029 Acute pharyngitis, unspecified: Secondary | ICD-10-CM

## 2020-07-03 MED ORDER — PREDNISONE 10 MG PO TABS: 30.0000 mg | ORAL_TABLET | Freq: Every day | ORAL | 0 refills | Status: AC

## 2020-07-03 NOTE — Progress Notes (Signed)
Virtual Visit Consent   KIRRAH MUSTIN, you are scheduled for a virtual visit with a [facility] provider today.     Just as with appointments in the office, your consent must be obtained to participate.  Your consent will be active for this visit and any virtual visit you may have with one of our providers in the next 365 days.     If you have a MyChart account, a copy of this consent can be sent to you electronically.  All virtual visits are billed to your insurance company just like a traditional visit in the office.    As this is a virtual visit, video technology does not allow for your provider to perform a traditional examination.  This may limit your provider's ability to fully assess your condition.  If your provider identifies any concerns that need to be evaluated in person or the need to arrange testing (such as labs, EKG, etc.), we will make arrangements to do so.     Although advances in technology are sophisticated, we cannot ensure that it will always work on either your end or our end.  If the connection with a video visit is poor, the visit may have to be switched to a telephone visit.  With either a video or telephone visit, we are not always able to ensure that we have a secure connection.     I need to obtain your verbal consent now.   Are you willing to proceed with your visit today?    Ceana Fiala Hernan has provided verbal consent on 07/03/2020 for a virtual visit (video or telephone).   Leeanne Rio, [place]   Date: 07/03/2020 7:44 AM   Virtual Visit via Video Note   I, Leeanne Rio, connected with Joyce Russell ([id number], 1970/05/07) on 07/03/20 at  7:30 AM EDT by a video-enabled telemedicine application and verified that I am speaking with the correct person using two identifiers.  Location: Patient: Virtual Visit Location Patient: Home Provider: Virtual Visit Location Provider: Home Office   I discussed the limitations of evaluation and management by  telemedicine and the availability of in person appointments. The patient expressed understanding and agreed to proceed.    History of Present Illness: Joyce Russell is a 50 y.o. who identifies as a female who was assigned female at birth, and is being seen today for 3 days of significant sore throat.  Patient endorses symptoms started Monday.  By Tuesday she had a low-grade fever with T-max less than 100. fever resolved within 24 hours.  Noted initially having some ear pressure and more significant cough that it seemed to improve.  Denies any major nasal congestion.  Main symptom is still the severe sore throat.  Notes her daughter was sick last week.  Both of had negative COVID test.  Daughter was evaluated in office last week and diagnosed with viral pharyngitis.  Strep testing was negative.  Patient states she has had to take ibuprofen pretty consistently and does not want to keep taking it if possible.  Wants to know what other options for treatment there may be.  HPI: HPI  Problems:  Patient Active Problem List   Diagnosis Date Noted   Obesity (BMI 30.0-34.9) 06/18/2020   Chronic tension headache    Overweight 04/03/2019   Uterine leiomyoma 11/06/2018   Mixed hyperlipidemia 09/01/2016   Essential hypertension 02/16/2015   Routine general medical examination at a health care facility 02/16/2015   Tension headache, chronic  Ectopic pregnancy 2012    Allergies: No Known Allergies Medications:  Current Outpatient Medications:    amLODipine (NORVASC) 5 MG tablet, Take 1 tablet (5 mg total) by mouth at bedtime., Disp: 90 tablet, Rfl: 3   butalbital-acetaminophen-caffeine (FIORICET) 50-325-40 MG tablet, Take 1-2 tablets by mouth every 6 (six) hours as needed for headache., Disp: 30 tablet, Rfl: 0   ibuprofen (ADVIL) 400 MG tablet, Take 400 mg by mouth every 6 (six) hours as needed for pain., Disp: , Rfl:    PARAGARD INTRAUTERINE COPPER IU, 1 Device by Intrauterine route once., Disp: , Rfl:     Pitavastatin Calcium 1 MG TABS, Take 1 tablet (1 mg total) by mouth daily., Disp: 90 tablet, Rfl: 3   Vitamin D, Ergocalciferol, (DRISDOL) 1.25 MG (50000 UNIT) CAPS capsule, Take 1 capsule (50,000 Units total) by mouth every 7 (seven) days., Disp: 12 capsule, Rfl: 0  Observations/Objective: Patient is well-developed, well-nourished in no acute distress.  Resting comfortably at home.  Head is normocephalic, atraumatic.  No labored breathing.  Speech is clear and coherent with logical content.  Patient is alert and oriented at baseline.   Assessment and Plan: 1. Acute viral pharyngitis  Other mild URI symptoms resolving. Only lingering significant sore throat with odynophagia. Negative COVID and known contact with viral pharyngitis who is back to baseline. Supportive measures and OTC medications reviewed with patient. Discussed this will have to run its course. Giving severity of sore throat, will add-on 3 day course of low-dose prednisone to help with inflammation and pain. Tylenol for breakthrough pain.   Follow Up Instructions: I discussed the assessment and treatment plan with the patient. The patient was provided an opportunity to ask questions and all were answered. The patient agreed with the plan and demonstrated an understanding of the instructions.  A copy of instructions were sent to the patient via MyChart.  The patient was advised to call back or seek an in-person evaluation if the symptoms worsen or if the condition fails to improve as anticipated.  Time:  I spent 12 minutes with the patient via telehealth technology discussing the above problems/concerns.    Leeanne Rio, PA-C

## 2020-07-03 NOTE — Patient Instructions (Signed)
  Mikael Spray Dutch, thank you for joining Leeanne Rio, PA-C for today's virtual visit.  While this provider is not your primary care provider (PCP), if your PCP is located in our provider database this encounter information will be shared with them immediately following your visit.  Consent: (Patient) Joyce Russell provided verbal consent for this virtual visit at the beginning of the encounter.  Current Medications:  Current Outpatient Medications:    predniSONE (DELTASONE) 10 MG tablet, Take 3 tablets (30 mg total) by mouth daily with breakfast for 3 days., Disp: 9 tablet, Rfl: 0   amLODipine (NORVASC) 5 MG tablet, Take 1 tablet (5 mg total) by mouth at bedtime., Disp: 90 tablet, Rfl: 3   butalbital-acetaminophen-caffeine (FIORICET) 50-325-40 MG tablet, Take 1-2 tablets by mouth every 6 (six) hours as needed for headache., Disp: 30 tablet, Rfl: 0   ibuprofen (ADVIL) 400 MG tablet, Take 400 mg by mouth every 6 (six) hours as needed for pain., Disp: , Rfl:    PARAGARD INTRAUTERINE COPPER IU, 1 Device by Intrauterine route once., Disp: , Rfl:    Pitavastatin Calcium 1 MG TABS, Take 1 tablet (1 mg total) by mouth daily., Disp: 90 tablet, Rfl: 3   Vitamin D, Ergocalciferol, (DRISDOL) 1.25 MG (50000 UNIT) CAPS capsule, Take 1 capsule (50,000 Units total) by mouth every 7 (seven) days., Disp: 12 capsule, Rfl: 0   Medications ordered in this encounter:  Meds ordered this encounter  Medications   predniSONE (DELTASONE) 10 MG tablet    Sig: Take 3 tablets (30 mg total) by mouth daily with breakfast for 3 days.    Dispense:  9 tablet    Refill:  0    Order Specific Question:   Supervising Provider    Answer:   Sabra Heck, BRIAN [[id number]]     *If you need refills on other medications prior to your next appointment, please contact your pharmacy*  Follow-Up: Call back or seek an in-person evaluation if the symptoms worsen or if the condition fails to improve as anticipated.  Other  Instructions Please keep well-hydrated and get plenty of rest. Take the prednisone as directed.  Tylenol for breakthrough pain. You can resume ibuprofen if needed once she completed course of steroid. Salt water gargles and Chloraseptic spray may also be beneficial. Symptoms should continue easing up until fully resolved. If not or if anything worsens or new symptoms develop, please reach out to [place] or follow-up with your primary care provider.   If you have been instructed to have an in-person evaluation today at a local Urgent Care facility, please use the link below. It will take you to a list of all of our available [facility], including address, phone number and hours of operation. Please do not delay care.  [facility]  If you or a family member do not have a primary care provider, use the link below to schedule a visit and establish care. When you choose a [facility] primary care physician or advanced practice provider, you gain a long-term partner in health. Find a Primary Care Provider  Learn more about [facility]'s in-office and virtual care options: [facility]

## 2020-08-26 ENCOUNTER — Other Ambulatory Visit (HOSPITAL_COMMUNITY): Payer: Self-pay

## 2020-09-22 ENCOUNTER — Other Ambulatory Visit (HOSPITAL_COMMUNITY): Payer: Self-pay

## 2020-10-08 ENCOUNTER — Encounter: Payer: Self-pay | Admitting: Gastroenterology

## 2020-11-18 DIAGNOSIS — Z1231 Encounter for screening mammogram for malignant neoplasm of breast: Secondary | ICD-10-CM | POA: Diagnosis not present

## 2020-11-24 ENCOUNTER — Other Ambulatory Visit (HOSPITAL_COMMUNITY): Payer: Self-pay

## 2020-11-30 ENCOUNTER — Other Ambulatory Visit (HOSPITAL_COMMUNITY): Payer: Self-pay

## 2020-11-30 ENCOUNTER — Ambulatory Visit (AMBULATORY_SURGERY_CENTER): Payer: 59 | Admitting: *Deleted

## 2020-11-30 ENCOUNTER — Encounter: Payer: Self-pay | Admitting: Gastroenterology

## 2020-11-30 ENCOUNTER — Other Ambulatory Visit: Payer: Self-pay

## 2020-11-30 VITALS — Ht 64.0 in | Wt 192.0 lb

## 2020-11-30 DIAGNOSIS — Z1211 Encounter for screening for malignant neoplasm of colon: Secondary | ICD-10-CM

## 2020-11-30 MED ORDER — NA SULFATE-K SULFATE-MG SULF 17.5-3.13-1.6 GM/177ML PO SOLN
354.0000 mL | Freq: Once | ORAL | 0 refills | Status: AC
Start: 2020-11-30 — End: 2020-12-03
  Filled 2020-11-30: qty 354, 1d supply, fill #0

## 2020-11-30 NOTE — Progress Notes (Signed)
Pre-visit conducted over telephone.  Instructions forwarded through MyChart and e-mail [email or web address]    No egg or soy allergy known to patient  No issues known to pt with past sedation with any surgeries or procedures Patient denies ever being told they had issues or difficulty with intubation  No FH of Malignant Hyperthermia Pt is not on diet pills Pt is not on  home 02  Pt is not on blood thinners  Pt denies issues with constipation  No A fib or A flutter  Pt is fully vaccinated  for Covid   Discussed with pt there will be an out-of-pocket cost for prep and that varies from $0 to 70 +  dollars - pt verbalized understanding   Due to the COVID-19 pandemic we are asking patients to follow certain guidelines in [place] and the [place]   Pt aware of COVID protocols and [facility] guidelines

## 2020-12-01 ENCOUNTER — Other Ambulatory Visit (HOSPITAL_COMMUNITY): Payer: Self-pay

## 2020-12-02 ENCOUNTER — Other Ambulatory Visit (HOSPITAL_COMMUNITY): Payer: Self-pay

## 2020-12-14 ENCOUNTER — Encounter: Payer: Self-pay | Admitting: Gastroenterology

## 2020-12-14 ENCOUNTER — Other Ambulatory Visit: Payer: Self-pay

## 2020-12-14 ENCOUNTER — Ambulatory Visit (AMBULATORY_SURGERY_CENTER): Payer: 59 | Admitting: Gastroenterology

## 2020-12-14 VITALS — BP 109/76 | HR 84 | Temp 98.4°F | Resp 22 | Ht 64.0 in | Wt 192.0 lb

## 2020-12-14 DIAGNOSIS — Z1211 Encounter for screening for malignant neoplasm of colon: Secondary | ICD-10-CM | POA: Diagnosis not present

## 2020-12-14 MED ORDER — SODIUM CHLORIDE 0.9 % IV SOLN: 500.0000 mL | Freq: Once | INTRAVENOUS | Status: DC

## 2020-12-14 NOTE — Op Note (Signed)
[facility] Patient Name: Joyce Russell Procedure Date: 12/14/2020 8:10 AM MRN: [id number] Endoscopist: Mauri Pole , MD Age: 50 Referring MD:  Date of Birth: 09-07-1970 Gender: Female Account #: [id number] Procedure:                Colonoscopy Indications:              Screening for colorectal malignant neoplasm Medicines:                Monitored Anesthesia Care Procedure:                Pre-Anesthesia Assessment:                           - Prior to the procedure, a History and Physical                            was performed, and patient medications and                            allergies were reviewed. The patient's tolerance of                            previous anesthesia was also reviewed. The risks                            and benefits of the procedure and the sedation                            options and risks were discussed with the patient.                            All questions were answered, and informed consent                            was obtained. Prior Anticoagulants: The patient has                            taken no previous anticoagulant or antiplatelet                            agents. ASA Grade Assessment: II - A patient with                            mild systemic disease. After reviewing the risks                            and benefits, the patient was deemed in                            satisfactory condition to undergo the procedure.                           After obtaining informed consent, the colonoscope  was passed under direct vision. Throughout the                            procedure, the patient's blood pressure, pulse, and                            oxygen saturations were monitored continuously. The                            Olympus PCF-H190DL (#[id number]) Colonoscope was                            introduced through the anus and advanced to the the                            cecum,  identified by appendiceal orifice and                            ileocecal valve. The colonoscopy was performed                            without difficulty. The patient tolerated the                            procedure well. The quality of the bowel                            preparation was adequate. The ileocecal valve,                            appendiceal orifice, and rectum were photographed. Scope In: 8:36:10 AM Scope Out: 8:56:51 AM Scope Withdrawal Time: 0 hours 13 minutes 32 seconds  Total Procedure Duration: 0 hours 20 minutes 41 seconds  Findings:                 The perianal and digital rectal examinations were                            normal.                           Non-bleeding external and internal hemorrhoids were                            found during retroflexion. The hemorrhoids were                            medium-sized.                           The exam was otherwise without abnormality. Complications:            No immediate complications. Estimated Blood Loss:     Estimated blood loss was minimal. Impression:               - Non-bleeding external and internal hemorrhoids.                           -  The examination was otherwise normal.                           - No specimens collected. Recommendation:           - Patient has a contact number available for                            emergencies. The signs and symptoms of potential                            delayed complications were discussed with the                            patient. Return to normal activities tomorrow.                            Written discharge instructions were provided to the                            patient.                           - Resume previous diet.                           - Continue present medications.                           - Repeat colonoscopy in 10 years for screening                            purposes.                           - For future  colonoscopy the patient will require                            an extended preparation. If there are any                            questions, please contact the gastroenterologist. Mauri Pole, MD 12/14/2020 9:02:28 AM This report has been signed electronically.

## 2020-12-14 NOTE — Patient Instructions (Signed)
Resume previous diet and medications. Repeat Colonoscopy in 10 years for surveillance.  YOU HAD AN ENDOSCOPIC PROCEDURE TODAY AT [facility]:   Refer to the procedure report that was given to you for any specific questions about what was found during the examination.  If the procedure report does not answer your questions, please call your gastroenterologist to clarify.  If you requested that your care partner not be given the details of your procedure findings, then the procedure report has been included in a sealed envelope for you to review at your convenience later.  YOU SHOULD EXPECT: Some feelings of bloating in the abdomen. Passage of more gas than usual.  Walking can help get rid of the air that was put into your GI tract during the procedure and reduce the bloating. If you had a lower endoscopy (such as a colonoscopy or flexible sigmoidoscopy) you may notice spotting of blood in your stool or on the toilet paper. If you underwent a bowel prep for your procedure, you may not have a normal bowel movement for a few days.  Please Note:  You might notice some irritation and congestion in your nose or some drainage.  This is from the oxygen used during your procedure.  There is no need for concern and it should clear up in a day or so.  SYMPTOMS TO REPORT IMMEDIATELY:  Following lower endoscopy (colonoscopy or flexible sigmoidoscopy):  Excessive amounts of blood in the stool  Significant tenderness or worsening of abdominal pains  Swelling of the abdomen that is new, acute  Fever of 100F or higher  For urgent or emergent issues, a gastroenterologist can be reached at any hour by calling [phone]. Do not use MyChart messaging for urgent concerns.    DIET:  We do recommend a small meal at first, but then you may proceed to your regular diet.  Drink plenty of fluids but you should avoid alcoholic beverages for 24 hours.  ACTIVITY:  You should plan to take it easy for the  rest of today and you should NOT DRIVE or use heavy machinery until tomorrow (because of the sedation medicines used during the test).    FOLLOW UP: Our staff will call the number listed on your records 48-72 hours following your procedure to check on you and address any questions or concerns that you may have regarding the information given to you following your procedure. If we do not reach you, we will leave a message.  We will attempt to reach you two times.  During this call, we will ask if you have developed any symptoms of COVID 19. If you develop any symptoms (ie: fever, flu-like symptoms, shortness of breath, cough etc.) before then, please call [phone].  If you test positive for Covid 19 in the 2 weeks post procedure, please call and report this information to [place].    If any biopsies were taken you will be contacted by phone or by letter within the next 1-3 weeks.  Please call us at [phone] if you have not heard about the biopsies in 3 weeks.    SIGNATURES/CONFIDENTIALITY: You and/or your care partner have signed paperwork which will be entered into your electronic medical record.  These signatures attest to the fact that that the information above on your After Visit Summary has been reviewed and is understood.  Full responsibility of the confidentiality of this discharge information lies with you and/or your care-partner.

## 2020-12-14 NOTE — Progress Notes (Signed)
[facility] History and Physical   Primary Care Physician:  Hoyt Koch, MD   Reason for Procedure:  Colorectal cancer screening  Plan:    Screening colonoscopy with possible interventions as needed     HPI: Joyce Russell is a very pleasant 50 y.o. femalehere for screening colonoscopy. Denies any nausea, vomiting, abdominal pain, melena or bright red blood per rectum  The risks and benefits as well as alternatives of endoscopic procedure(s) have been discussed and reviewed. All questions answered. The patient agrees to proceed.    Past Medical History:  Diagnosis Date   Chronic tension headache    Ectopic pregnancy 2012   right   Ectopic pregnancy 2012   right   Essential hypertension 02/16/2015   Mixed hyperlipidemia 09/01/2016   Overweight 04/03/2019   Routine general medical examination at a health care facility 02/16/2015   Tension headache, chronic    Uterine leiomyoma 11/06/2018    Past Surgical History:  Procedure Laterality Date   SALPINGECTOMY  03/2010   R   WISDOM TOOTH EXTRACTION      Prior to Admission medications   Medication Sig Start Date End Date Taking? Authorizing Provider  amLODipine (NORVASC) 5 MG tablet Take 1 tablet (5 mg total) by mouth at bedtime. 05/19/20  Yes Hoyt Koch, MD  PARAGARD INTRAUTERINE COPPER IU 1 Device by Intrauterine route once. 05/30/11  Yes [provider]  Pitavastatin Calcium 1 MG TABS Take 1 tablet (1 mg total) by mouth daily. 06/23/20  Yes Revankar, Reita Cliche, MD  butalbital-acetaminophen-caffeine (FIORICET) 50-325-40 MG tablet Take 1-2 tablets by mouth every 6 (six) hours as needed for headache. 05/22/20 05/22/21  Hoyt Koch, MD  ibuprofen (ADVIL) 400 MG tablet Take 400 mg by mouth every 6 (six) hours as needed for pain.    [provider]  Vitamin D, Ergocalciferol, (DRISDOL) 1.25 MG (50000 UNIT) CAPS capsule Take 1 capsule (50,000 Units total) by mouth every 7 (seven)  days. Patient not taking: Reported on 11/30/2020 05/18/20   Hoyt Koch, MD    Current Outpatient Medications  Medication Sig Dispense Refill   amLODipine (NORVASC) 5 MG tablet Take 1 tablet (5 mg total) by mouth at bedtime. 90 tablet 3   PARAGARD INTRAUTERINE COPPER IU 1 Device by Intrauterine route once.     Pitavastatin Calcium 1 MG TABS Take 1 tablet (1 mg total) by mouth daily. 90 tablet 3   butalbital-acetaminophen-caffeine (FIORICET) 50-325-40 MG tablet Take 1-2 tablets by mouth every 6 (six) hours as needed for headache. 30 tablet 0   ibuprofen (ADVIL) 400 MG tablet Take 400 mg by mouth every 6 (six) hours as needed for pain.     Vitamin D, Ergocalciferol, (DRISDOL) 1.25 MG (50000 UNIT) CAPS capsule Take 1 capsule (50,000 Units total) by mouth every 7 (seven) days. (Patient not taking: Reported on 11/30/2020) 12 capsule 0   Current Facility-Administered Medications  Medication Dose Route Frequency Provider Last Rate Last Admin   0.9 %  sodium chloride infusion  500 mL Intravenous Once Mauri Pole, MD        Allergies as of 12/14/2020   (No Known Allergies)    Family History  Problem Relation Age of Onset   Hypertension Mother    Glaucoma Mother    Diabetes Father    Glaucoma Father    Heart disease Sister        tachycardia   Hypertension Sister    Esophageal cancer Maternal Uncle  Diabetes Paternal Aunt    Diabetes Maternal Grandfather    Hypertension Maternal Grandfather    Stroke Maternal Grandfather 53   Cancer Paternal Grandmother        uterine   Colon cancer Neg Hx    Colon polyps Neg Hx    Stomach cancer Neg Hx    Rectal cancer Neg Hx     Social History   Socioeconomic History   Marital status: Married    Spouse name: Not on file   Number of children: Not on file   Years of education: Not on file   Highest education level: Not on file  Occupational History   Not on file  Tobacco Use   Smoking status: Never   Smokeless tobacco:  Never  Vaping Use   Vaping Use: Never used  Substance and Sexual Activity   Alcohol use: No   Drug use: No   Sexual activity: Yes    Birth control/protection: I.U.D., Abstinence    Comment: PARAGARD  Other Topics Concern   Not on file  Social History Narrative   Lives with spouse, 3 children and mother in law   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  Physical Exam: Vital signs in last 24 hours: BP (!) 138/97   Pulse (!) 114   Temp 98.4 F (36.9 C)   Ht 5\' 4"  (1.626 m)   Wt 192 lb (87.1 kg)   SpO2 99%   BMI 32.96 kg/m     General:   Alert, NAD Lungs:  Clear .   Heart:  Regular rate and rhythm Abdomen:  Soft, nontender and nondistended. Neuro/Psych:  Alert and cooperative. Normal mood and affect. A and O x 3  Reviewed labs, radiology imaging, old records and pertinent past GI work up  Patient is appropriate for planned procedure(s) and anesthesia in an ambulatory setting   K. Denzil Magnuson , MD [phone]

## 2020-12-14 NOTE — Progress Notes (Signed)
PT taken to PACU. Monitors in place. VSS. Report given to RN. 

## 2020-12-14 NOTE — Progress Notes (Signed)
VS by CW  Pt's states no medical or surgical changes since previsit or office visit.  

## 2020-12-16 ENCOUNTER — Telehealth: Payer: Self-pay | Admitting: *Deleted

## 2020-12-16 NOTE — Telephone Encounter (Signed)
  Follow up Call-  Call back number 12/14/2020  Post procedure Call Back phone  # [phone]  Permission to leave phone message Yes  Some recent data might be hidden     Patient questions:  Do you have a fever, pain , or abdominal swelling? No. Pain Score  0 *  Have you tolerated food without any problems? Yes.    Have you been able to return to your normal activities? Yes.    Do you have any questions about your discharge instructions: Diet   No. Medications  No. Follow up visit  No.  Do you have questions or concerns about your Care? No.  Actions: * If pain score is 4 or above: No action needed, pain <4.  Have you developed a fever since your procedure? no  2.   Have you had an respiratory symptoms (SOB or cough) since your procedure? no  3.   Have you tested positive for COVID 19 since your procedure no  4.   Have you had any family members/close contacts diagnosed with the COVID 19 since your procedure?  no   If yes to any of these questions please route to Joylene [name], RN and Joella Prince, RN

## 2020-12-24 ENCOUNTER — Other Ambulatory Visit (HOSPITAL_COMMUNITY): Payer: Self-pay

## 2021-02-08 DIAGNOSIS — Z1231 Encounter for screening mammogram for malignant neoplasm of breast: Secondary | ICD-10-CM | POA: Diagnosis not present

## 2021-02-08 DIAGNOSIS — Z01419 Encounter for gynecological examination (general) (routine) without abnormal findings: Secondary | ICD-10-CM | POA: Diagnosis not present

## 2021-02-08 DIAGNOSIS — Z124 Encounter for screening for malignant neoplasm of cervix: Secondary | ICD-10-CM | POA: Diagnosis not present

## 2021-02-08 DIAGNOSIS — Z3040 Encounter for surveillance of contraceptives, unspecified: Secondary | ICD-10-CM | POA: Diagnosis not present

## 2021-02-08 DIAGNOSIS — Z6833 Body mass index (BMI) 33.0-33.9, adult: Secondary | ICD-10-CM | POA: Diagnosis not present

## 2021-02-08 DIAGNOSIS — Z1211 Encounter for screening for malignant neoplasm of colon: Secondary | ICD-10-CM | POA: Diagnosis not present

## 2021-02-26 ENCOUNTER — Other Ambulatory Visit: Payer: Self-pay | Admitting: Internal Medicine

## 2021-02-26 ENCOUNTER — Other Ambulatory Visit (HOSPITAL_COMMUNITY): Payer: Self-pay

## 2021-03-01 ENCOUNTER — Other Ambulatory Visit (HOSPITAL_COMMUNITY): Payer: Self-pay

## 2021-03-01 MED ORDER — BUTALBITAL-APAP-CAFFEINE 50-325-40 MG PO TABS
1.0000 | ORAL_TABLET | Freq: Four times a day (QID) | ORAL | 0 refills | Status: DC | PRN
  Filled 2021-03-01: qty 30, 4d supply, fill #0

## 2021-03-05 ENCOUNTER — Other Ambulatory Visit (HOSPITAL_COMMUNITY): Payer: Self-pay

## 2021-03-05 ENCOUNTER — Encounter: Payer: Self-pay | Admitting: Cardiology

## 2021-03-05 ENCOUNTER — Ambulatory Visit (INDEPENDENT_AMBULATORY_CARE_PROVIDER_SITE_OTHER): Payer: 59 | Admitting: Cardiology

## 2021-03-05 ENCOUNTER — Other Ambulatory Visit: Payer: Self-pay

## 2021-03-05 VITALS — BP 130/86 | HR 88 | Ht 64.0 in | Wt 192.1 lb

## 2021-03-05 DIAGNOSIS — I10 Essential (primary) hypertension: Secondary | ICD-10-CM | POA: Diagnosis not present

## 2021-03-05 DIAGNOSIS — Z131 Encounter for screening for diabetes mellitus: Secondary | ICD-10-CM | POA: Diagnosis not present

## 2021-03-05 DIAGNOSIS — E559 Vitamin D deficiency, unspecified: Secondary | ICD-10-CM | POA: Diagnosis not present

## 2021-03-05 DIAGNOSIS — Z1329 Encounter for screening for other suspected endocrine disorder: Secondary | ICD-10-CM | POA: Diagnosis not present

## 2021-03-05 DIAGNOSIS — E782 Mixed hyperlipidemia: Secondary | ICD-10-CM | POA: Diagnosis not present

## 2021-03-05 MED ORDER — PITAVASTATIN CALCIUM 1 MG PO TABS
1.0000 mg | ORAL_TABLET | Freq: Every day | ORAL | 3 refills | Status: DC
  Filled 2021-03-05 – 2021-03-25 (×2): qty 90, 90d supply, fill #0
  Filled 2021-06-24: qty 90, 90d supply, fill #1
  Filled 2021-10-04: qty 90, 90d supply, fill #2
  Filled 2022-01-05: qty 90, 90d supply, fill #3

## 2021-03-05 NOTE — Progress Notes (Signed)
Cardiology Office Note:    Date:  03/05/2021   ID:  [name], DOB [birth year], MRN [id number]  PCP:  Hoyt Koch, MD  Cardiologist:  Jenean Lindau, MD   Referring MD: Hoyt Koch, *    ASSESSMENT:    1. Essential hypertension   2. Mixed hyperlipidemia   3. Thyroid disorder screen   4. Vitamin D deficiency   5. Screening for diabetes mellitus    PLAN:    In order of problems listed above:  Primary prevention stressed with the patient.  Importance of compliance with diet medication stressed and she vocalized understanding.  She was advised to walk at least half an hour a day 5 days a week and she promises to do so. Essential hypertension: Blood pressure stable and diet was emphasized. Mixed dyslipidemia: On statin therapy.  Diet emphasized.  She will have complete blood work today. History of vitamin D deficiency we will check vitamin D levels today and also hemoglobin A1c Obesity: Weight reduction stressed lifestyle modification urged Patient will be seen in follow-up appointment in 9 months or earlier if the patient has any concerns    Medication Adjustments/Labs and Tests Ordered: Current medicines are reviewed at length with the patient today.  Concerns regarding medicines are outlined above.  Orders Placed This Encounter  Procedures   Basic metabolic panel   CBC with Differential/Platelet   Hepatic function panel   Lipid panel   TSH   VITAMIN D 25 Hydroxy (Vit-D Deficiency, Fractures)   Hemoglobin A1c   Meds ordered this encounter  Medications   Pitavastatin Calcium 1 MG TABS    Sig: Take 1 tablet by mouth daily.    Dispense:  90 tablet    Refill:  3     No chief complaint on file.    History of Present Illness:    Joyce Russell is a 51 y.o. female.  Patient has past medical history of essential hypertension, dyslipidemia and obesity.  She denies any problems at this time and takes care of activities of daily living.  No chest  pain orthopnea or PND.  She is advised to walk and get exercise on a regular basis.  At the time of my evaluation, the patient is alert awake oriented and in no distress.  Past Medical History:  Diagnosis Date   Chronic tension headache    Ectopic pregnancy 2012   right   Essential hypertension 02/16/2015   Mixed hyperlipidemia 09/01/2016   Obesity (BMI 30.0-34.9) 06/18/2020   Overweight 04/03/2019   Routine general medical examination at a health care facility 02/16/2015   Tension headache, chronic    Uterine leiomyoma 11/06/2018    Past Surgical History:  Procedure Laterality Date   SALPINGECTOMY  03/2010   R   WISDOM TOOTH EXTRACTION      Current Medications: Current Meds  Medication Sig   amLODipine (NORVASC) 5 MG tablet Take 1 tablet (5 mg total) by mouth at bedtime.   butalbital-acetaminophen-caffeine (FIORICET) 50-325-40 MG tablet Take 1-2 tablets by mouth every 6 (six) hours as needed for headache.   ibuprofen (ADVIL) 400 MG tablet Take 400 mg by mouth every 6 (six) hours as needed for pain.   PARAGARD INTRAUTERINE COPPER IU 1 Device by Intrauterine route once.   Vitamin D, Ergocalciferol, (DRISDOL) 1.25 MG (50000 UNIT) CAPS capsule Take 1 capsule (50,000 Units total) by mouth every 7 (seven) days.   [DISCONTINUED] Pitavastatin Calcium 1 MG TABS Take 1 tablet (1 mg  total) by mouth daily.     Allergies:   Patient has no known allergies.   Social History   Socioeconomic History   Marital status: Married    Spouse name: Not on file   Number of children: Not on file   Years of education: Not on file   Highest education level: Not on file  Occupational History   Not on file  Tobacco Use   Smoking status: Never   Smokeless tobacco: Never  Vaping Use   Vaping Use: Never used  Substance and Sexual Activity   Alcohol use: No   Drug use: No   Sexual activity: Yes    Birth control/protection: I.U.D., Abstinence    Comment: PARAGARD  Other Topics Concern   Not on  file  Social History Narrative   Lives with spouse, 3 children and mother in law   CM @ [facility]   Social Determinants of Health   Financial Resource Strain: Not on file  Food Insecurity: Not on file  Transportation Needs: Not on file  Physical Activity: Not on file  Stress: Not on file  Social Connections: Not on file     Family History: The patient's family history includes Cancer in her paternal grandmother; Diabetes in her father, maternal grandfather, and paternal aunt; Esophageal cancer in her maternal uncle; Glaucoma in her father and mother; Heart disease in her sister; Hypertension in her maternal grandfather, mother, and sister; Stroke (age of onset: 78) in her maternal grandfather. There is no history of Colon cancer, Colon polyps, Stomach cancer, or Rectal cancer.  ROS:   Please see the history of present illness.    All other systems reviewed and are negative.  EKGs/Labs/Other Studies Reviewed:    The following studies were reviewed today: I discussed my findings with the patient at length   Recent Labs: 05/12/2020: ALT 34; BUN 11; Creatinine, Ser 0.75; Hemoglobin 13.2; Platelets 289.0; Potassium 3.8; Sodium 136; TSH 1.74  Recent Lipid Panel    Component Value Date/Time   CHOL 171 05/12/2020 0912   CHOL 183 08/16/2019 0859   TRIG 65.0 05/12/2020 0912   HDL 39.60 05/12/2020 0912   HDL 41 08/16/2019 0859   CHOLHDL 4 05/12/2020 0912   VLDL 13.0 05/12/2020 0912   LDLCALC 118 (H) 05/12/2020 0912   LDLCALC 123 (H) 08/16/2019 0859   LDLDIRECT 197.5 10/12/2012 1011    Physical Exam:    VS:  BP 130/86    Pulse 88    Ht 5\' 4"  (1.626 m)    Wt 192 lb 1.9 oz (87.1 kg)    SpO2 99%    BMI 32.98 kg/m     Wt Readings from Last 3 Encounters:  03/05/21 192 lb 1.9 oz (87.1 kg)  12/14/20 192 lb (87.1 kg)  11/30/20 192 lb (87.1 kg)     GEN: Patient is in no acute distress HEENT: Normal NECK: No JVD; No carotid bruits LYMPHATICS: No lymphadenopathy CARDIAC: Hear  sounds regular, 2/6 systolic murmur at the apex. RESPIRATORY:  Clear to auscultation without rales, wheezing or rhonchi  ABDOMEN: Soft, non-tender, non-distended MUSCULOSKELETAL:  No edema; No deformity  SKIN: Warm and dry NEUROLOGIC:  Alert and oriented x 3 PSYCHIATRIC:  Normal affect   Signed, Jenean Lindau, MD  03/05/2021 10:40 AM    [facility]

## 2021-03-05 NOTE — Patient Instructions (Signed)
Medication Instructions:  Your physician recommends that you continue on your current medications as directed. Please refer to the Current Medication list given to you today.  *If you need a refill on your cardiac medications before your next appointment, please call your pharmacy*   Lab Work: Your physician recommends that you have labs done in the office today. Your test included  basic metabolic panel, complete blood count, TSH, hgb A1C, vitamin D level,  liver function and lipids.  If you have labs (blood work) drawn today and your tests are completely normal, you will receive your results only by: [place] (if you have MyChart) OR A paper copy in the mail If you have any lab test that is abnormal or we need to change your treatment, we will call you to review the results.   Testing/Procedures: None ordered   Follow-Up: At [facility], you and your health needs are our priority.  As part of our continuing mission to provide you with exceptional heart care, we have created designated Provider Care Teams.  These Care Teams include your primary Cardiologist (physician) and Advanced Practice Providers (APPs -  Physician Assistants and Nurse Practitioners) who all work together to provide you with the care you need, when you need it.  We recommend signing up for the patient portal called "MyChart".  Sign up information is provided on this After Visit Summary.  MyChart is used to connect with patients for Virtual Visits (Telemedicine).  Patients are able to view lab/test results, encounter notes, upcoming appointments, etc.  Non-urgent messages can be sent to your provider as well.   To learn more about what you can do with MyChart, go to [email or web address].    Your next appointment:   9 month(s)  The format for your next appointment:   In Person  Provider:   Jyl Heinz, MD   Other Instructions NA

## 2021-03-06 LAB — CBC WITH DIFFERENTIAL/PLATELET
Basophils Absolute: 0 10*3/uL (ref 0.0–0.2)
Basos: 1 %
EOS (ABSOLUTE): 0.1 10*3/uL (ref 0.0–0.4)
Eos: 1 %
Hematocrit: 38.5 % (ref 34.0–46.6)
Hemoglobin: 12.1 g/dL (ref 11.1–15.9)
Immature Grans (Abs): 0 10*3/uL (ref 0.0–0.1)
Immature Granulocytes: 0 %
Lymphocytes Absolute: 1.6 10*3/uL (ref 0.7–3.1)
Lymphs: 31 %
MCH: 22.6 pg — ABNORMAL LOW (ref 26.6–33.0)
MCHC: 31.4 g/dL — ABNORMAL LOW (ref 31.5–35.7)
MCV: 72 fL — ABNORMAL LOW (ref 79–97)
Monocytes Absolute: 0.4 10*3/uL (ref 0.1–0.9)
Monocytes: 8 %
Neutrophils Absolute: 2.9 10*3/uL (ref 1.4–7.0)
Neutrophils: 59 %
Platelets: 361 10*3/uL (ref 150–450)
RBC: 5.35 x10E6/uL — ABNORMAL HIGH (ref 3.77–5.28)
RDW: 17.2 % — ABNORMAL HIGH (ref 11.7–15.4)
WBC: 5 10*3/uL (ref 3.4–10.8)

## 2021-03-06 LAB — HEPATIC FUNCTION PANEL
ALT: 42 IU/L — ABNORMAL HIGH (ref 0–32)
AST: 26 IU/L (ref 0–40)
Albumin: 4.1 g/dL (ref 3.8–4.8)
Alkaline Phosphatase: 74 IU/L (ref 44–121)
Bilirubin Total: 0.2 mg/dL (ref 0.0–1.2)
Bilirubin, Direct: <0.1 mg/dL (ref 0.00–0.40)
Total Protein: 7.3 g/dL (ref 6.0–8.5)

## 2021-03-06 LAB — BASIC METABOLIC PANEL
BUN/Creatinine Ratio: 14 (ref 9–23)
BUN: 10 mg/dL (ref 6–24)
CO2: 23 mmol/L (ref 20–29)
Calcium: 8.9 mg/dL (ref 8.7–10.2)
Chloride: 105 mmol/L (ref 96–106)
Creatinine, Ser: 0.71 mg/dL (ref 0.57–1.00)
Glucose: 85 mg/dL (ref 70–99)
Potassium: 4.2 mmol/L (ref 3.5–5.2)
Sodium: 140 mmol/L (ref 134–144)
eGFR: 105 mL/min/{1.73_m2} (ref >59)

## 2021-03-06 LAB — LIPID PANEL
Chol/HDL Ratio: 4 ratio (ref 0.0–4.4)
Cholesterol, Total: 181 mg/dL (ref 100–199)
HDL: 45 mg/dL (ref >39)
LDL Chol Calc (NIH): 125 mg/dL — ABNORMAL HIGH (ref 0–99)
Triglycerides: 56 mg/dL (ref 0–149)
VLDL Cholesterol Cal: 11 mg/dL (ref 5–40)

## 2021-03-06 LAB — VITAMIN D 25 HYDROXY (VIT D DEFICIENCY, FRACTURES): Vit D, 25-Hydroxy: 21.1 ng/mL — ABNORMAL LOW (ref 30.0–100.0)

## 2021-03-06 LAB — HEMOGLOBIN A1C
Est. average glucose Bld gHb Est-mCnc: 117 mg/dL
Hgb A1c MFr Bld: 5.7 % — ABNORMAL HIGH (ref 4.8–5.6)

## 2021-03-06 LAB — TSH: TSH: 1.61 u[IU]/mL (ref 0.450–4.500)

## 2021-03-08 ENCOUNTER — Other Ambulatory Visit (HOSPITAL_COMMUNITY): Payer: Self-pay

## 2021-03-08 ENCOUNTER — Telehealth: Payer: Self-pay

## 2021-03-08 NOTE — Telephone Encounter (Signed)
Spoke with patient regarding results and recommendation.  Patient verbalizes understanding and is agreeable to plan of care. Advised patient to call back with any issues or concerns.  

## 2021-03-08 NOTE — Telephone Encounter (Signed)
-----   Message from Jenean Lindau, MD sent at 03/08/2021  9:42 AM EST ----- Abnormal labs. She needs to speak to pcp for these. Diet to reduce wt and cholesterol and exercise cc pcp Jenean Lindau, MD 03/08/2021 9:41 AM

## 2021-03-16 ENCOUNTER — Other Ambulatory Visit (HOSPITAL_COMMUNITY): Payer: Self-pay

## 2021-03-25 ENCOUNTER — Other Ambulatory Visit (HOSPITAL_COMMUNITY): Payer: Self-pay

## 2021-03-26 ENCOUNTER — Other Ambulatory Visit (HOSPITAL_COMMUNITY): Payer: Self-pay

## 2021-05-17 DIAGNOSIS — Z30432 Encounter for removal of intrauterine contraceptive device: Secondary | ICD-10-CM | POA: Diagnosis not present

## 2021-05-17 DIAGNOSIS — Z113 Encounter for screening for infections with a predominantly sexual mode of transmission: Secondary | ICD-10-CM | POA: Diagnosis not present

## 2021-05-17 DIAGNOSIS — Z30430 Encounter for insertion of intrauterine contraceptive device: Secondary | ICD-10-CM | POA: Diagnosis not present

## 2021-06-02 ENCOUNTER — Other Ambulatory Visit: Payer: Self-pay | Admitting: Internal Medicine

## 2021-06-02 DIAGNOSIS — R030 Elevated blood-pressure reading, without diagnosis of hypertension: Secondary | ICD-10-CM

## 2021-06-03 ENCOUNTER — Other Ambulatory Visit (HOSPITAL_COMMUNITY): Payer: Self-pay

## 2021-06-03 MED ORDER — AMLODIPINE BESYLATE 5 MG PO TABS
5.0000 mg | ORAL_TABLET | Freq: Every day | ORAL | 0 refills | Status: DC
  Filled 2021-06-03: qty 30, 30d supply, fill #0

## 2021-06-07 DIAGNOSIS — Z30431 Encounter for routine checking of intrauterine contraceptive device: Secondary | ICD-10-CM | POA: Diagnosis not present

## 2021-06-25 ENCOUNTER — Other Ambulatory Visit (HOSPITAL_COMMUNITY): Payer: Self-pay

## 2021-07-02 ENCOUNTER — Other Ambulatory Visit (HOSPITAL_COMMUNITY): Payer: Self-pay

## 2021-07-02 ENCOUNTER — Ambulatory Visit (INDEPENDENT_AMBULATORY_CARE_PROVIDER_SITE_OTHER): Payer: 59 | Admitting: Internal Medicine

## 2021-07-02 ENCOUNTER — Telehealth: Payer: Self-pay | Admitting: *Deleted

## 2021-07-02 ENCOUNTER — Encounter: Payer: Self-pay | Admitting: Internal Medicine

## 2021-07-02 VITALS — BP 128/84 | HR 75 | Temp 98.3°F | Ht 64.0 in | Wt 195.0 lb

## 2021-07-02 DIAGNOSIS — R030 Elevated blood-pressure reading, without diagnosis of hypertension: Secondary | ICD-10-CM

## 2021-07-02 DIAGNOSIS — E782 Mixed hyperlipidemia: Secondary | ICD-10-CM

## 2021-07-02 DIAGNOSIS — Z0000 Encounter for general adult medical examination without abnormal findings: Secondary | ICD-10-CM | POA: Diagnosis not present

## 2021-07-02 DIAGNOSIS — I10 Essential (primary) hypertension: Secondary | ICD-10-CM

## 2021-07-02 DIAGNOSIS — Z7184 Encounter for health counseling related to travel: Secondary | ICD-10-CM

## 2021-07-02 DIAGNOSIS — Z23 Encounter for immunization: Secondary | ICD-10-CM | POA: Diagnosis not present

## 2021-07-02 MED ORDER — AMLODIPINE BESYLATE 5 MG PO TABS
5.0000 mg | ORAL_TABLET | Freq: Every day | ORAL | 3 refills | Status: DC
  Filled 2021-07-02: qty 90, 90d supply, fill #0
  Filled 2021-10-04: qty 90, 90d supply, fill #1
  Filled 2022-01-05: qty 90, 90d supply, fill #2
  Filled 2022-04-14: qty 90, 90d supply, fill #3

## 2021-07-02 MED ORDER — MEFLOQUINE HCL 250 MG PO TABS
250.0000 mg | ORAL_TABLET | ORAL | 0 refills | Status: DC
  Filled 2021-07-02: qty 12, 84d supply, fill #0

## 2021-07-02 MED ORDER — VIVOTIF PO CPDR
1.0000 | DELAYED_RELEASE_CAPSULE | ORAL | 0 refills | Status: DC
Start: 2021-07-02 — End: 2022-07-29
  Filled 2021-07-02: qty 4, 8d supply, fill #0

## 2021-07-02 NOTE — Assessment & Plan Note (Signed)
Taking amlodipine 5 mg daily and recent labs reviewed and appropriate. Continue at current dosing.

## 2021-07-02 NOTE — Telephone Encounter (Signed)
Form faxed to get PAP results.

## 2021-07-02 NOTE — Telephone Encounter (Signed)
Called patient and left message for patient to call when her GYN doctors office name. Need to get PAP results.

## 2021-07-02 NOTE — Assessment & Plan Note (Signed)
LDL at goal on recent labs at 126 (goal <130). Taking pitavastatin 1 mg daily and will continue.

## 2021-07-02 NOTE — Progress Notes (Signed)
   Subjective:   Patient ID: Joyce Russell, female    DOB: 1970-07-31, 51 y.o.   MRN: [id number]  HPI The patient is here for physical.  PMH, [facility], social history reviewed and updated  Review of Systems  Constitutional: Negative.   HENT: Negative.    Eyes: Negative.   Respiratory:  Negative for cough, chest tightness and shortness of breath.   Cardiovascular:  Negative for chest pain, palpitations and leg swelling.  Gastrointestinal:  Negative for abdominal distention, abdominal pain, constipation, diarrhea, nausea and vomiting.  Musculoskeletal: Negative.   Skin: Negative.   Neurological: Negative.   Psychiatric/Behavioral: Negative.      Objective:  Physical Exam Constitutional:      Appearance: She is well-developed.  HENT:     Head: Normocephalic and atraumatic.  Cardiovascular:     Rate and Rhythm: Normal rate and regular rhythm.  Pulmonary:     Effort: Pulmonary effort is normal. No respiratory distress.     Breath sounds: Normal breath sounds. No wheezing or rales.  Abdominal:     General: Bowel sounds are normal. There is no distension.     Palpations: Abdomen is soft.     Tenderness: There is no abdominal tenderness. There is no rebound.  Musculoskeletal:     Cervical back: Normal range of motion.  Skin:    General: Skin is warm and dry.  Neurological:     Mental Status: She is alert and oriented to person, place, and time.     Coordination: Coordination normal.     Vitals:   07/02/21 0848  BP: 128/84  Pulse: 75  Temp: 98.3 F (36.8 C)  TempSrc: Oral  SpO2: 97%  Weight: 195 lb (88.5 kg)  Height: '5\' 4"'$  (1.626 m)    This visit occurred during the SARS-CoV-2 public health emergency.  Safety protocols were in place, including screening questions prior to the visit, additional usage of staff PPE, and extensive cleaning of exam room while observing appropriate contact time as indicated for disinfecting solutions.   Assessment & Plan:  Tdap given at  visit

## 2021-07-02 NOTE — Patient Instructions (Signed)
We have sent in typhoid vaccine. Take 1 capsule every other day for 4 doses. Finish at least 2 weeks prior to travel.  We have sent in the malaria prevention mefloquine to take 1 pill weekly starting 2 weeks prior to travel and continuing during travel and 4 weeks after travel.

## 2021-07-02 NOTE — Telephone Encounter (Signed)
Pt called to give gyn name and pap results. Pt said PAP results were normal. Her GYN is Dr. Cletis Media at [facility] for her. [phone].

## 2021-07-02 NOTE — Assessment & Plan Note (Signed)
Flu shot yearly. Covid-19 counseled. Tetanus given. Colonoscopy up to date. Mammogram up to date, pap smear up to date. Counseled about sun safety and mole surveillance. Counseled about the dangers of distracted driving. Given 10 year screening recommendations.

## 2021-07-02 NOTE — Assessment & Plan Note (Signed)
Going to [place] later this year. Needs yellow fever and typhoid and malaria prevention. Tdap given today. Rx typhoid vaccination oral and mefloquine for malaria prevention. Explained how to take effectively.

## 2021-07-05 ENCOUNTER — Other Ambulatory Visit (HOSPITAL_COMMUNITY): Payer: Self-pay

## 2021-07-06 ENCOUNTER — Other Ambulatory Visit (HOSPITAL_COMMUNITY): Payer: Self-pay

## 2021-08-12 ENCOUNTER — Other Ambulatory Visit: Payer: Self-pay | Admitting: Internal Medicine

## 2021-08-12 ENCOUNTER — Other Ambulatory Visit (HOSPITAL_COMMUNITY): Payer: Self-pay

## 2021-08-12 MED ORDER — BUTALBITAL-APAP-CAFFEINE 50-325-40 MG PO TABS
1.0000 | ORAL_TABLET | Freq: Four times a day (QID) | ORAL | 3 refills | Status: DC | PRN
  Filled 2021-08-12: qty 30, 4d supply, fill #0

## 2021-10-04 ENCOUNTER — Other Ambulatory Visit (HOSPITAL_COMMUNITY): Payer: Self-pay

## 2021-10-05 ENCOUNTER — Other Ambulatory Visit (HOSPITAL_COMMUNITY): Payer: Self-pay

## 2021-10-06 ENCOUNTER — Other Ambulatory Visit (HOSPITAL_COMMUNITY): Payer: Self-pay

## 2021-11-24 ENCOUNTER — Other Ambulatory Visit (HOSPITAL_COMMUNITY): Payer: Self-pay

## 2021-11-24 DIAGNOSIS — N939 Abnormal uterine and vaginal bleeding, unspecified: Secondary | ICD-10-CM | POA: Diagnosis not present

## 2021-11-24 DIAGNOSIS — R102 Pelvic and perineal pain: Secondary | ICD-10-CM | POA: Diagnosis not present

## 2021-11-24 DIAGNOSIS — D259 Leiomyoma of uterus, unspecified: Secondary | ICD-10-CM | POA: Diagnosis not present

## 2021-11-24 MED ORDER — NORETHINDRONE ACETATE 5 MG PO TABS
ORAL_TABLET | ORAL | 1 refills | Status: DC
  Filled 2021-11-24: qty 30, 15d supply, fill #0

## 2021-12-14 ENCOUNTER — Other Ambulatory Visit (HOSPITAL_COMMUNITY): Payer: Self-pay

## 2021-12-14 DIAGNOSIS — T8332XA Displacement of intrauterine contraceptive device, initial encounter: Secondary | ICD-10-CM | POA: Diagnosis not present

## 2021-12-14 DIAGNOSIS — N939 Abnormal uterine and vaginal bleeding, unspecified: Secondary | ICD-10-CM | POA: Diagnosis not present

## 2021-12-14 DIAGNOSIS — R102 Pelvic and perineal pain: Secondary | ICD-10-CM | POA: Diagnosis not present

## 2021-12-14 DIAGNOSIS — D259 Leiomyoma of uterus, unspecified: Secondary | ICD-10-CM | POA: Diagnosis not present

## 2021-12-14 MED ORDER — NORETHINDRONE ACETATE 5 MG PO TABS
10.0000 mg | ORAL_TABLET | Freq: Every day | ORAL | 1 refills | Status: DC
  Filled 2021-12-14: qty 60, 30d supply, fill #0
  Filled 2022-01-13: qty 60, 30d supply, fill #1

## 2022-01-06 ENCOUNTER — Other Ambulatory Visit: Payer: Self-pay

## 2022-01-07 ENCOUNTER — Other Ambulatory Visit (HOSPITAL_COMMUNITY): Payer: Self-pay

## 2022-01-07 ENCOUNTER — Other Ambulatory Visit: Payer: Self-pay

## 2022-01-07 DIAGNOSIS — T8332XD Displacement of intrauterine contraceptive device, subsequent encounter: Secondary | ICD-10-CM | POA: Diagnosis not present

## 2022-01-07 DIAGNOSIS — N939 Abnormal uterine and vaginal bleeding, unspecified: Secondary | ICD-10-CM | POA: Diagnosis not present

## 2022-01-07 DIAGNOSIS — D259 Leiomyoma of uterus, unspecified: Secondary | ICD-10-CM | POA: Diagnosis not present

## 2022-01-07 DIAGNOSIS — T8332XA Displacement of intrauterine contraceptive device, initial encounter: Secondary | ICD-10-CM | POA: Diagnosis not present

## 2022-01-07 MED ORDER — ACETAMINOPHEN 500 MG PO TABS: ORAL_TABLET | ORAL | 1 refills | Status: DC

## 2022-01-07 MED ORDER — IBUPROFEN 600 MG PO TABS
ORAL_TABLET | ORAL | 1 refills | Status: AC
  Filled 2022-01-07: qty 30, 7d supply, fill #0

## 2022-01-07 MED ORDER — OXYCODONE HCL 5 MG PO TABS
ORAL_TABLET | ORAL | 0 refills | Status: DC
  Filled 2022-01-07: qty 5, 1d supply, fill #0

## 2022-01-19 DIAGNOSIS — D259 Leiomyoma of uterus, unspecified: Secondary | ICD-10-CM | POA: Diagnosis not present

## 2022-01-19 DIAGNOSIS — T8332XA Displacement of intrauterine contraceptive device, initial encounter: Secondary | ICD-10-CM | POA: Diagnosis not present

## 2022-03-01 ENCOUNTER — Other Ambulatory Visit (HOSPITAL_COMMUNITY): Payer: Self-pay

## 2022-03-01 DIAGNOSIS — T8389XA Other specified complication of genitourinary prosthetic devices, implants and grafts, initial encounter: Secondary | ICD-10-CM | POA: Diagnosis not present

## 2022-03-01 DIAGNOSIS — Z309 Encounter for contraceptive management, unspecified: Secondary | ICD-10-CM | POA: Diagnosis not present

## 2022-03-01 DIAGNOSIS — D259 Leiomyoma of uterus, unspecified: Secondary | ICD-10-CM | POA: Diagnosis not present

## 2022-03-01 MED ORDER — TRANEXAMIC ACID 650 MG PO TABS
1300.0000 mg | ORAL_TABLET | Freq: Three times a day (TID) | ORAL | 2 refills | Status: DC
  Filled 2022-03-01: qty 30, 5d supply, fill #0

## 2022-03-15 ENCOUNTER — Other Ambulatory Visit: Payer: Self-pay | Admitting: Internal Medicine

## 2022-03-16 NOTE — Telephone Encounter (Signed)
She should still have refills on prior rx can just contact pharmacy

## 2022-03-21 ENCOUNTER — Other Ambulatory Visit: Payer: Self-pay | Admitting: Internal Medicine

## 2022-03-21 ENCOUNTER — Other Ambulatory Visit (HOSPITAL_COMMUNITY): Payer: Self-pay

## 2022-03-22 NOTE — Telephone Encounter (Signed)
She should still have refills can contact pharmacy to fill.

## 2022-03-26 ENCOUNTER — Other Ambulatory Visit (HOSPITAL_COMMUNITY): Payer: Self-pay

## 2022-03-28 ENCOUNTER — Other Ambulatory Visit (HOSPITAL_COMMUNITY): Payer: Self-pay

## 2022-03-28 MED ORDER — BUTALBITAL-APAP-CAFFEINE 50-325-40 MG PO TABS
1.0000 | ORAL_TABLET | Freq: Four times a day (QID) | ORAL | 0 refills | Status: DC | PRN
  Filled 2022-03-28: qty 30, 4d supply, fill #0

## 2022-03-28 NOTE — Addendum Note (Signed)
Addended by: Pricilla Holm A on: 03/28/2022 01:02 PM   Modules accepted: Orders

## 2022-03-28 NOTE — Telephone Encounter (Signed)
Patient states that she does not have any refills - please send in again.

## 2022-04-14 ENCOUNTER — Other Ambulatory Visit (HOSPITAL_COMMUNITY): Payer: Self-pay

## 2022-04-14 ENCOUNTER — Other Ambulatory Visit: Payer: Self-pay

## 2022-04-14 ENCOUNTER — Other Ambulatory Visit: Payer: Self-pay | Admitting: Cardiology

## 2022-04-14 DIAGNOSIS — I10 Essential (primary) hypertension: Secondary | ICD-10-CM

## 2022-04-14 DIAGNOSIS — E782 Mixed hyperlipidemia: Secondary | ICD-10-CM

## 2022-04-14 MED ORDER — PITAVASTATIN CALCIUM 1 MG PO TABS
1.0000 mg | ORAL_TABLET | Freq: Every day | ORAL | 0 refills | Status: DC
  Filled 2022-04-14: qty 30, 30d supply, fill #0

## 2022-04-15 ENCOUNTER — Other Ambulatory Visit: Payer: Self-pay

## 2022-04-15 ENCOUNTER — Other Ambulatory Visit (HOSPITAL_COMMUNITY): Payer: Self-pay

## 2022-04-20 DIAGNOSIS — Z309 Encounter for contraceptive management, unspecified: Secondary | ICD-10-CM | POA: Diagnosis not present

## 2022-04-20 DIAGNOSIS — Z1211 Encounter for screening for malignant neoplasm of colon: Secondary | ICD-10-CM | POA: Diagnosis not present

## 2022-04-20 DIAGNOSIS — Z01419 Encounter for gynecological examination (general) (routine) without abnormal findings: Secondary | ICD-10-CM | POA: Diagnosis not present

## 2022-04-20 DIAGNOSIS — Z1231 Encounter for screening mammogram for malignant neoplasm of breast: Secondary | ICD-10-CM | POA: Diagnosis not present

## 2022-04-20 DIAGNOSIS — Z6833 Body mass index (BMI) 33.0-33.9, adult: Secondary | ICD-10-CM | POA: Diagnosis not present

## 2022-04-20 DIAGNOSIS — D259 Leiomyoma of uterus, unspecified: Secondary | ICD-10-CM | POA: Diagnosis not present

## 2022-05-11 DIAGNOSIS — Z30430 Encounter for insertion of intrauterine contraceptive device: Secondary | ICD-10-CM | POA: Diagnosis not present

## 2022-05-11 DIAGNOSIS — Z113 Encounter for screening for infections with a predominantly sexual mode of transmission: Secondary | ICD-10-CM | POA: Diagnosis not present

## 2022-05-19 DIAGNOSIS — Z30431 Encounter for routine checking of intrauterine contraceptive device: Secondary | ICD-10-CM | POA: Diagnosis not present

## 2022-06-23 ENCOUNTER — Other Ambulatory Visit (HOSPITAL_COMMUNITY): Payer: Self-pay

## 2022-06-23 ENCOUNTER — Telehealth: Payer: Self-pay | Admitting: Cardiology

## 2022-06-23 DIAGNOSIS — E782 Mixed hyperlipidemia: Secondary | ICD-10-CM

## 2022-06-23 DIAGNOSIS — I10 Essential (primary) hypertension: Secondary | ICD-10-CM

## 2022-06-23 MED ORDER — PITAVASTATIN CALCIUM 1 MG PO TABS
1.0000 mg | ORAL_TABLET | Freq: Every day | ORAL | 0 refills | Status: DC
  Filled 2022-06-23: qty 90, 90d supply, fill #0

## 2022-06-23 NOTE — Telephone Encounter (Signed)
*  STAT* If patient is at the pharmacy, call can be transferred to refill team.   1. Which medications need to be refilled? (please list name of each medication and dose if known) Pitavastatin Calcium (LIVALO) 1 MG TABS   2. Which pharmacy/location (including street and city if local pharmacy) is medication to be sent to?  [facility]   3. Do they need a 30 day or 90 day supply? 90    Pt states she is completely out, she is scheduled for 09/14/22 with Dr. Tomie China

## 2022-06-23 NOTE — Telephone Encounter (Signed)
RX sent

## 2022-06-24 ENCOUNTER — Other Ambulatory Visit (HOSPITAL_COMMUNITY): Payer: Self-pay

## 2022-07-25 ENCOUNTER — Other Ambulatory Visit (HOSPITAL_COMMUNITY): Payer: Self-pay

## 2022-07-25 ENCOUNTER — Other Ambulatory Visit: Payer: Self-pay

## 2022-07-25 ENCOUNTER — Telehealth: Payer: Self-pay | Admitting: Internal Medicine

## 2022-07-25 DIAGNOSIS — R030 Elevated blood-pressure reading, without diagnosis of hypertension: Secondary | ICD-10-CM

## 2022-07-25 MED ORDER — AMLODIPINE BESYLATE 5 MG PO TABS
5.0000 mg | ORAL_TABLET | Freq: Every day | ORAL | 0 refills | Status: DC
  Filled 2022-07-25: qty 30, 30d supply, fill #0

## 2022-07-25 NOTE — Telephone Encounter (Signed)
Pt has an upcoming appt and also going out of the country.  Pt wants to know if she can get a temporary refill until her OV.  Prescription Request  07/25/2022  LOV: Visit date not found  What is the name of the medication or equipment?  amLODipine (NORVASC) 5 MG tablet   Have you contacted your pharmacy to request a refill? No   Which pharmacy would you like this sent to?     [facility] [address] [place] [address] Phone: [phone] Fax: [phone]  Patient notified that their request is being sent to the clinical staff for review and that they should receive a response within 2 business days.   Please advise at Mobile [phone] (mobile)

## 2022-07-29 ENCOUNTER — Encounter: Payer: Self-pay | Admitting: Family Medicine

## 2022-07-29 ENCOUNTER — Other Ambulatory Visit (HOSPITAL_COMMUNITY): Payer: Self-pay

## 2022-07-29 ENCOUNTER — Telehealth: Payer: Self-pay | Admitting: Internal Medicine

## 2022-07-29 ENCOUNTER — Ambulatory Visit: Payer: 59 | Admitting: Family Medicine

## 2022-07-29 ENCOUNTER — Other Ambulatory Visit: Payer: Self-pay

## 2022-07-29 VITALS — BP 126/84 | HR 93 | Temp 97.6°F | Ht 64.0 in | Wt 201.0 lb

## 2022-07-29 DIAGNOSIS — Z2989 Encounter for other specified prophylactic measures: Secondary | ICD-10-CM

## 2022-07-29 MED ORDER — MEFLOQUINE HCL 250 MG PO TABS
250.0000 mg | ORAL_TABLET | ORAL | 0 refills | Status: DC
Start: 2022-07-29 — End: 2022-08-30
  Filled 2022-07-29: qty 13, 90d supply, fill #0

## 2022-07-29 MED ORDER — ATOVAQUONE-PROGUANIL HCL 250-100 MG PO TABS
1.0000 | ORAL_TABLET | Freq: Every day | ORAL | 0 refills | Status: DC
Start: 2022-07-29 — End: 2022-07-29
  Filled 2022-07-29: qty 55, 55d supply, fill #0

## 2022-07-29 NOTE — Progress Notes (Signed)
Subjective:     Patient ID: Joyce Russell, female    DOB: 03/05/1970, 52 y.o.   MRN: [id number]  Chief Complaint  Patient presents with   Travel Consult    Needs antimalarial medication for [place]    HPI  Discussed the use of AI scribe software for clinical note transcription with the patient, who gave verbal consent to proceed.  History of Present Illness          Here requesting antimalarial medication for trip to [place].  07/22-09/01/20224 She has taken medication in the past and done well with it.     Health Maintenance Due  Topic Date Due   Hepatitis C Screening  Never done   PAP SMEAR-Modifier  11/08/2019   MAMMOGRAM  03/13/2022   Zoster Vaccines- Shingrix (1 of 2) Never done    Past Medical History:  Diagnosis Date   Chronic tension headache    Ectopic pregnancy 2012   right   Essential hypertension 02/16/2015   Mixed hyperlipidemia 09/01/2016   Obesity (BMI 30.0-34.9) 06/18/2020   Overweight 04/03/2019   Routine general medical examination at a health care facility 02/16/2015   Tension headache, chronic    Uterine leiomyoma 11/06/2018    Past Surgical History:  Procedure Laterality Date   SALPINGECTOMY  03/2010   R   WISDOM TOOTH EXTRACTION      Family History  Problem Relation Age of Onset   Hypertension Mother    Glaucoma Mother    Diabetes Father    Glaucoma Father    Heart disease Sister        tachycardia   Hypertension Sister    Esophageal cancer Maternal Uncle    Diabetes Paternal Aunt    Diabetes Maternal Grandfather    Hypertension Maternal Grandfather    Stroke Maternal Grandfather 59   Cancer Paternal Grandmother        uterine   Colon cancer Neg Hx    Colon polyps Neg Hx    Stomach cancer Neg Hx    Rectal cancer Neg Hx     Social History   Socioeconomic History   Marital status: Married    Spouse name: Not on file   Number of children: Not on file   Years of education: Not on file   Highest education level: Not on  file  Occupational History   Not on file  Tobacco Use   Smoking status: Never   Smokeless tobacco: Never  Vaping Use   Vaping status: Never Used  Substance and Sexual Activity   Alcohol use: No   Drug use: No   Sexual activity: Yes    Birth control/protection: I.U.D., Abstinence    Comment: PARAGARD  Other Topics Concern   Not on file  Social History Narrative   Lives with spouse, 3 children and mother in law   CM @ [facility]   Social Determinants of Health   Financial Resource Strain: Not on file  Food Insecurity: Not on file  Transportation Needs: Not on file  Physical Activity: Not on file  Stress: Not on file  Social Connections: Not on file  Intimate Partner Violence: Not on file    Outpatient Medications Prior to Visit  Medication Sig Dispense Refill   amLODipine (NORVASC) 5 MG tablet Take 1 tablet (5 mg) by mouth at bedtime. Please call office to schedule follow up visit for further refills. 30 tablet 0   butalbital-acetaminophen-caffeine (FIORICET) 50-325-40 MG tablet Take 1 - 2 tablets by  mouth every 6 hours as needed for headache. 30 tablet 0   ibuprofen (ADVIL) 400 MG tablet Take 400 mg by mouth every 6 (six) hours as needed for pain.     ibuprofen (ADVIL) 600 MG tablet Take 1 tablet by mouth every 6 hours with acetaminophen 1000 mg as needed for pain >3/10 30 tablet 1   norethindrone (AYGESTIN) 5 MG tablet Take 1 tablet by mouth daily at bedtime. May increase to 2 tablets daily if bleeding continues to be heavy in 48-72 hours 30 tablet 1   norethindrone (AYGESTIN) 5 MG tablet Take 2 tablets (10 mg total) by mouth at bedtime. 60 tablet 1   oxyCODONE (OXY IR/ROXICODONE) 5 MG immediate release tablet Take 1 tablet by mouth every 4 hours if pain not responding to Ibuprofen 600 mg/acetaminophen 1000 mg 5 tablet 0   PARAGARD INTRAUTERINE COPPER IU 1 Device by Intrauterine route once.     Pitavastatin Calcium (LIVALO) 1 MG TABS Take 1 tablet (1 mg) by mouth daily.  Patient must keep August appointment to obtain additional refills. 90 tablet 0   tranexamic acid (LYSTEDA) 650 MG TABS tablet Take 2 tablets (1,300 mg total) by mouth 3 (three) times daily for 5 days 30 tablet 2   mefloquine (LARIAM) 250 MG tablet Take 1 tablet (250 mg total) by mouth every 7 (seven) days. Start 2 weeks prior to travel, continue 4 weeks after return 12 tablet 0   typhoid (VIVOTIF) DR capsule Take 1 capsule by mouth every other day. (Patient not taking: Reported on 07/29/2022) 4 capsule 0   No facility-administered medications prior to visit.    No Known Allergies  ROS     Objective:    Physical Exam Constitutional:      General: She is not in acute distress.    Appearance: She is not ill-appearing.  Eyes:     Conjunctiva/sclera: Conjunctivae normal.  Cardiovascular:     Rate and Rhythm: Normal rate.  Pulmonary:     Effort: Pulmonary effort is normal.  Musculoskeletal:     Cervical back: Normal range of motion.  Skin:    General: Skin is warm and dry.  Neurological:     General: No focal deficit present.     Mental Status: She is alert and oriented to person, place, and time.  Psychiatric:        Mood and Affect: Mood normal.        Behavior: Behavior normal.        Thought Content: Thought content normal.      BP 126/84 (BP Location: Left Arm, Patient Position: Sitting, Cuff Size: Large)   Pulse 93   Temp 97.6 F (36.4 C) (Temporal)   Ht 5\' 4"  (1.626 m)   Wt 201 lb (91.2 kg)   SpO2 99%   BMI 34.50 kg/m  Wt Readings from Last 3 Encounters:  07/29/22 201 lb (91.2 kg)  07/02/21 195 lb (88.5 kg)  03/05/21 192 lb 1.9 oz (87.1 kg)       Assessment & Plan:   Problem List Items Addressed This Visit   None Visit Diagnoses     Need for malaria prophylaxis    -  Primary   Relevant Medications   mefloquine (LARIAM) 250 MG tablet      She prefers weekly mefloquine. She has done well in the past with this medication.   I have discontinued Joeanne Schweigert. Anker's Vivotif and atovaquone-proguanil. I am also having her maintain her PARAGARD INTRAUTERINE  COPPER IU, ibuprofen, norethindrone, norethindrone, ibuprofen, oxyCODONE, tranexamic acid, butalbital-acetaminophen-caffeine, Pitavastatin Calcium, amLODipine, and mefloquine.  Meds ordered this encounter  Medications   DISCONTD: atovaquone-proguanil (MALARONE) 250-100 MG TABS tablet    Sig: Take 1 tablet by mouth daily. Take with food.  Repeat dose if vomiting occurs within 30 minutes -- Start 2 days before exposure. Stop 7 days after exposure.    Dispense:  55 tablet    Refill:  0    Order Specific Question:   Supervising Provider    Answer:   Hillard Danker A [[id number]]   mefloquine (LARIAM) 250 MG tablet    Sig: Take 1 tablet (250 mg total) by mouth every 7 (seven) days. Start 2 weeks prior to travel, continue 4 weeks after return    Dispense:  13 tablet    Refill:  0    Order Specific Question:   Supervising Provider    Answer:   Hillard Danker A [[id number]]

## 2022-07-29 NOTE — Telephone Encounter (Signed)
Patient was seen by Hetty Blend today 07/29/22 and was prescribed atovaquone-proguanil (MALARONE) 250-100 MG TABS tablet because she is traveling. Patient said she has not taken this medication before and is usually prescribed mesloquine. She said she is not comfortable taking a new medication at this time. She would like to know if it can be changed to her usual medication. Patient would like a call back at [phone].

## 2022-07-29 NOTE — Telephone Encounter (Signed)
Called pt and relayed response, pt verbalized she would still like to switch to the one she knows if it could be sent in please.

## 2022-08-01 ENCOUNTER — Other Ambulatory Visit (HOSPITAL_COMMUNITY): Payer: Self-pay

## 2022-08-06 ENCOUNTER — Other Ambulatory Visit (HOSPITAL_COMMUNITY): Payer: Self-pay

## 2022-08-06 ENCOUNTER — Telehealth: Payer: 59 | Admitting: Nurse Practitioner

## 2022-08-06 DIAGNOSIS — T7840XA Allergy, unspecified, initial encounter: Secondary | ICD-10-CM

## 2022-08-06 MED ORDER — PREDNISONE 20 MG PO TABS
40.0000 mg | ORAL_TABLET | Freq: Every day | ORAL | 0 refills | Status: AC
Start: 2022-08-06 — End: 2022-08-11
  Filled 2022-08-06: qty 10, 5d supply, fill #0

## 2022-08-06 NOTE — Patient Instructions (Signed)
Debby [facility] [place], thank you for joining Bennie Pierini, FNP for today's virtual visit.  While this provider is not your primary care provider (PCP), if your PCP is located in our provider database this encounter information will be shared with them immediately following your visit.   A [facility] MyChart account gives you access to today's visit and all your visits, tests, and labs performed at [facility] " click here if you don't have a [facility] MyChart account or go to [email or web address]  Consent: (Patient) Joyce Russell provided verbal consent for this virtual visit at the beginning of the encounter.  Current Medications:  Current Outpatient Medications:    predniSONE (DELTASONE) 20 MG tablet, Take 2 tablets (40 mg total) by mouth daily with breakfast for 5 days. 2 po daily for 5 days, Disp: 10 tablet, Rfl: 0   amLODipine (NORVASC) 5 MG tablet, Take 1 tablet (5 mg) by mouth at bedtime. Please call office to schedule follow up visit for further refills., Disp: 30 tablet, Rfl: 0   butalbital-acetaminophen-caffeine (FIORICET) 50-325-40 MG tablet, Take 1 - 2 tablets by mouth every 6 hours as needed for headache., Disp: 30 tablet, Rfl: 0   ibuprofen (ADVIL) 400 MG tablet, Take 400 mg by mouth every 6 (six) hours as needed for pain., Disp: , Rfl:    ibuprofen (ADVIL) 600 MG tablet, Take 1 tablet by mouth every 6 hours with acetaminophen 1000 mg as needed for pain >3/10, Disp: 30 tablet, Rfl: 1   mefloquine (LARIAM) 250 MG tablet, Take 1 tablet (250 mg total) by mouth every 7 (seven) days. Start 2 weeks prior to travel, continue 4 weeks after return, Disp: 13 tablet, Rfl: 0   norethindrone (AYGESTIN) 5 MG tablet, Take 1 tablet by mouth daily at bedtime. May increase to 2 tablets daily if bleeding continues to be heavy in 48-72 hours, Disp: 30 tablet, Rfl: 1   norethindrone (AYGESTIN) 5 MG tablet, Take 2 tablets (10 mg total) by mouth at bedtime., Disp: 60 tablet, Rfl: 1    oxyCODONE (OXY IR/ROXICODONE) 5 MG immediate release tablet, Take 1 tablet by mouth every 4 hours if pain not responding to Ibuprofen 600 mg/acetaminophen 1000 mg, Disp: 5 tablet, Rfl: 0   PARAGARD INTRAUTERINE COPPER IU, 1 Device by Intrauterine route once., Disp: , Rfl:    Pitavastatin Calcium (LIVALO) 1 MG TABS, Take 1 tablet (1 mg) by mouth daily. Patient must keep August appointment to obtain additional refills., Disp: 90 tablet, Rfl: 0   tranexamic acid (LYSTEDA) 650 MG TABS tablet, Take 2 tablets (1,300 mg total) by mouth 3 (three) times daily for 5 days, Disp: 30 tablet, Rfl: 2   Medications ordered in this encounter:  Meds ordered this encounter  Medications   predniSONE (DELTASONE) 20 MG tablet    Sig: Take 2 tablets (40 mg total) by mouth daily with breakfast for 5 days. 2 po daily for 5 days    Dispense:  10 tablet    Refill:  0    Order Specific Question:   Supervising Provider    Answer:   Merrilee Jansky [id number]     *If you need refills on other medications prior to your next appointment, please contact your pharmacy*  Follow-Up: Call back or seek an in-person evaluation if the symptoms worsen or if the condition fails to improve as anticipated.  [facility] [phone]  Other Instructions No steroid cream on face Avoid scratching Cool compresses if needed Needs to  be seen if not improving   If you have been instructed to have an in-person evaluation today at a local Urgent Care facility, please use the link below. It will take you to a list of all of our available [facility], including address, phone number and hours of operation. Please do not delay care.  [facility]  If you or a family member do not have a primary care provider, use the link below to schedule a visit and establish care. When you choose a [facility] primary care physician or advanced practice provider, you gain a long-term partner in health. Find a  Primary Care Provider  Learn more about [facility]'s in-office and virtual care options: [facility] - Get Care Now

## 2022-08-06 NOTE — Progress Notes (Signed)
Virtual Visit Consent   Joyce Russell, you are scheduled for a virtual visit with Mary-Margaret Daphine Deutscher, FNP, a [facility] provider, today.     Just as with appointments in the office, your consent must be obtained to participate.  Your consent will be active for this visit and any virtual visit you may have with one of our providers in the next 365 days.     If you have a MyChart account, a copy of this consent can be sent to you electronically.  All virtual visits are billed to your insurance company just like a traditional visit in the office.    As this is a virtual visit, video technology does not allow for your provider to perform a traditional examination.  This may limit your provider's ability to fully assess your condition.  If your provider identifies any concerns that need to be evaluated in person or the need to arrange testing (such as labs, EKG, etc.), we will make arrangements to do so.     Although advances in technology are sophisticated, we cannot ensure that it will always work on either your end or our end.  If the connection with a video visit is poor, the visit may have to be switched to a telephone visit.  With either a video or telephone visit, we are not always able to ensure that we have a secure connection.     I need to obtain your verbal consent now.   Are you willing to proceed with your visit today? YES   Joyce Russell has provided verbal consent on 08/06/2022 for a virtual visit (video or telephone).   Mary-Margaret Daphine Deutscher, FNP   Date: 08/06/2022 8:50 AM   Virtual Visit via Video Note   I, Mary-Margaret Daphine Deutscher, connected with Joyce Russell ([id number], 1970-06-24) on 08/06/22 at  9:00 AM EDT by a video-enabled telemedicine application and verified that I am speaking with the correct person using two identifiers.  Location: Patient: Virtual Visit Location Patient: Home Provider: Virtual Visit Location Provider: Mobile   I discussed the limitations of  evaluation and management by telemedicine and the availability of in person appointments. The patient expressed understanding and agreed to proceed.    History of Present Illness: Joyce Russell is a 52 y.o. who identifies as a female who was assigned female at birth, and is being seen today for allergic reaction.  HPI: Patient went to eye brow appointment for waxing. When she was leaving she felt some burning in the area. Has gradually gotten worse and is now swollen. She has been going to salon every 2 weeks for the last 10 years and this has never happened before. She took some benadryl 3 x since yesterday.    ROS  Problems:  Patient Active Problem List   Diagnosis Date Noted   Travel advice encounter 07/02/2021   Obesity (BMI 30.0-34.9) 06/18/2020   Chronic tension headache    Uterine leiomyoma 11/06/2018   Mixed hyperlipidemia 09/01/2016   Essential hypertension 02/16/2015   Routine general medical examination at a health care facility 02/16/2015    Allergies: No Known Allergies Medications:  Current Outpatient Medications:    amLODipine (NORVASC) 5 MG tablet, Take 1 tablet (5 mg) by mouth at bedtime. Please call office to schedule follow up visit for further refills., Disp: 30 tablet, Rfl: 0   butalbital-acetaminophen-caffeine (FIORICET) 50-325-40 MG tablet, Take 1 - 2 tablets by mouth every 6 hours as needed for headache., Disp: 30 tablet,  Rfl: 0   ibuprofen (ADVIL) 400 MG tablet, Take 400 mg by mouth every 6 (six) hours as needed for pain., Disp: , Rfl:    ibuprofen (ADVIL) 600 MG tablet, Take 1 tablet by mouth every 6 hours with acetaminophen 1000 mg as needed for pain >3/10, Disp: 30 tablet, Rfl: 1   mefloquine (LARIAM) 250 MG tablet, Take 1 tablet (250 mg total) by mouth every 7 (seven) days. Start 2 weeks prior to travel, continue 4 weeks after return, Disp: 13 tablet, Rfl: 0   norethindrone (AYGESTIN) 5 MG tablet, Take 1 tablet by mouth daily at bedtime. May increase to 2  tablets daily if bleeding continues to be heavy in 48-72 hours, Disp: 30 tablet, Rfl: 1   norethindrone (AYGESTIN) 5 MG tablet, Take 2 tablets (10 mg total) by mouth at bedtime., Disp: 60 tablet, Rfl: 1   oxyCODONE (OXY IR/ROXICODONE) 5 MG immediate release tablet, Take 1 tablet by mouth every 4 hours if pain not responding to Ibuprofen 600 mg/acetaminophen 1000 mg, Disp: 5 tablet, Rfl: 0   PARAGARD INTRAUTERINE COPPER IU, 1 Device by Intrauterine route once., Disp: , Rfl:    Pitavastatin Calcium (LIVALO) 1 MG TABS, Take 1 tablet (1 mg) by mouth daily. Patient must keep August appointment to obtain additional refills., Disp: 90 tablet, Rfl: 0   tranexamic acid (LYSTEDA) 650 MG TABS tablet, Take 2 tablets (1,300 mg total) by mouth 3 (three) times daily for 5 days, Disp: 30 tablet, Rfl: 2  Observations/Objective: Patient is well-developed, well-nourished in no acute distress.  Resting comfortably  at home.  Head is normocephalic, atraumatic.  No labored breathing.  Speech is clear and coherent with logical content.  Patient is alert and oriented at baseline.  Fine rash on bil upper lids with facial tightness. No facial swelling visible.  Assessment and Plan:  Joyce Russell in today with chief complaint of Allergic Reaction   1. Allergic reaction, initial encounter No steroid cream on face Avoid scratching Cool compresses if needed Needs to be seen if not improving - predniSONE (DELTASONE) 20 MG tablet; Take 2 tablets (40 mg total) by mouth daily with breakfast for 5 days. 2 po daily for 5 days  Dispense: 10 tablet; Refill: 0     Follow Up Instructions: I discussed the assessment and treatment plan with the patient. The patient was provided an opportunity to ask questions and all were answered. The patient agreed with the plan and demonstrated an understanding of the instructions.  A copy of instructions were sent to the patient via MyChart.  The patient was advised to call back or  seek an in-person evaluation if the symptoms worsen or if the condition fails to improve as anticipated.  Time:  I spent 9 minutes with the patient via telehealth technology discussing the above problems/concerns.    Mary-Margaret Daphine Deutscher, FNP

## 2022-08-17 ENCOUNTER — Encounter (INDEPENDENT_AMBULATORY_CARE_PROVIDER_SITE_OTHER): Payer: Self-pay

## 2022-08-30 ENCOUNTER — Other Ambulatory Visit (HOSPITAL_COMMUNITY): Payer: Self-pay

## 2022-08-30 ENCOUNTER — Ambulatory Visit: Payer: 59 | Admitting: Internal Medicine

## 2022-08-30 ENCOUNTER — Encounter: Payer: Self-pay | Admitting: Internal Medicine

## 2022-08-30 VITALS — BP 130/82 | HR 76 | Temp 98.6°F | Ht 64.0 in | Wt 199.0 lb

## 2022-08-30 DIAGNOSIS — E782 Mixed hyperlipidemia: Secondary | ICD-10-CM

## 2022-08-30 DIAGNOSIS — I10 Essential (primary) hypertension: Secondary | ICD-10-CM | POA: Diagnosis not present

## 2022-08-30 DIAGNOSIS — Z0000 Encounter for general adult medical examination without abnormal findings: Secondary | ICD-10-CM

## 2022-08-30 DIAGNOSIS — Z23 Encounter for immunization: Secondary | ICD-10-CM

## 2022-08-30 DIAGNOSIS — R030 Elevated blood-pressure reading, without diagnosis of hypertension: Secondary | ICD-10-CM

## 2022-08-30 DIAGNOSIS — G44229 Chronic tension-type headache, not intractable: Secondary | ICD-10-CM | POA: Diagnosis not present

## 2022-08-30 LAB — CBC
HCT: 36.1 % (ref 36.0–46.0)
Hemoglobin: 11.1 g/dL — ABNORMAL LOW (ref 12.0–15.0)
MCHC: 30.7 g/dL (ref 30.0–36.0)
MCV: 66.2 fl — ABNORMAL LOW (ref 78.0–100.0)
Platelets: 394 10*3/uL (ref 150.0–400.0)
RBC: 5.45 Mil/uL — ABNORMAL HIGH (ref 3.87–5.11)
RDW: 21.3 % — ABNORMAL HIGH (ref 11.5–15.5)
WBC: 5.9 10*3/uL (ref 4.0–10.5)

## 2022-08-30 LAB — COMPREHENSIVE METABOLIC PANEL
ALT: 16 U/L (ref 0–35)
AST: 17 U/L (ref 0–37)
Albumin: 3.9 g/dL (ref 3.5–5.2)
Alkaline Phosphatase: 55 U/L (ref 39–117)
BUN: 11 mg/dL (ref 6–23)
CO2: 24 mEq/L (ref 19–32)
Calcium: 8.7 mg/dL (ref 8.4–10.5)
Chloride: 101 mEq/L (ref 96–112)
Creatinine, Ser: 0.67 mg/dL (ref 0.40–1.20)
GFR: 101.88 mL/min (ref >60.00)
Glucose, Bld: 77 mg/dL (ref 70–99)
Potassium: 3.8 mEq/L (ref 3.5–5.1)
Sodium: 134 mEq/L — ABNORMAL LOW (ref 135–145)
Total Bilirubin: 0.3 mg/dL (ref 0.2–1.2)
Total Protein: 7.6 g/dL (ref 6.0–8.3)

## 2022-08-30 LAB — LIPID PANEL
Cholesterol: 183 mg/dL (ref 0–200)
HDL: 38.9 mg/dL — ABNORMAL LOW (ref >39.00)
LDL Cholesterol: 129 mg/dL — ABNORMAL HIGH (ref 0–99)
NonHDL: 144.35
Total CHOL/HDL Ratio: 5
Triglycerides: 76 mg/dL (ref 0.0–149.0)
VLDL: 15.2 mg/dL (ref 0.0–40.0)

## 2022-08-30 LAB — HEMOGLOBIN A1C: Hgb A1c MFr Bld: 5.9 % (ref 4.6–6.5)

## 2022-08-30 LAB — VITAMIN D 25 HYDROXY (VIT D DEFICIENCY, FRACTURES): VITD: 23.36 ng/mL — ABNORMAL LOW (ref 30.00–100.00)

## 2022-08-30 MED ORDER — AMLODIPINE BESYLATE 5 MG PO TABS
5.0000 mg | ORAL_TABLET | Freq: Every day | ORAL | 3 refills | Status: DC
Start: 2022-08-30 — End: 2023-10-04
  Filled 2022-08-30 – 2022-09-12 (×2): qty 90, 90d supply, fill #0
  Filled 2022-12-28: qty 90, 90d supply, fill #1
  Filled 2023-04-06: qty 90, 90d supply, fill #2
  Filled 2023-06-30: qty 90, 90d supply, fill #3
  Filled ????-??-??: fill #0

## 2022-08-30 MED ORDER — BUTALBITAL-APAP-CAFFEINE 50-325-40 MG PO TABS
1.0000 | ORAL_TABLET | Freq: Four times a day (QID) | ORAL | 2 refills | Status: DC | PRN
  Filled 2022-08-30 – 2022-09-12 (×2): qty 30, 4d supply, fill #0
  Filled ????-??-??: fill #0

## 2022-08-30 NOTE — Assessment & Plan Note (Signed)
Flu shot yearly.. Shingrix 1st given today. Tetanus up to date. Colonoscopy up to date. Mammogram up to date, pap smear up to date. Counseled about sun safety and mole surveillance. Counseled about the dangers of distracted driving. Given 10 year screening recommendations.

## 2022-08-30 NOTE — Assessment & Plan Note (Signed)
Checking lipid panel and adjust livalo 1 mg daily as needed. Cardiology prescribing currently and advised we can take this on if/when needed.

## 2022-08-30 NOTE — Assessment & Plan Note (Signed)
Refilled fioricet which she uses rarely for headaches.

## 2022-08-30 NOTE — Assessment & Plan Note (Signed)
BP at goal on amlodipine 5 mg daily and refilled. Checking CMP and adjust as needed.

## 2022-08-30 NOTE — Progress Notes (Signed)
   Subjective:   Patient ID: Joyce Russell, female    DOB: 1970-10-06, 52 y.o.   MRN: [id number]  HPI The patient is here for physical.  PMH, [facility], social history reviewed and updated  Review of Systems  Constitutional: Negative.   HENT: Negative.    Eyes: Negative.   Respiratory:  Negative for cough, chest tightness and shortness of breath.   Cardiovascular:  Negative for chest pain, palpitations and leg swelling.  Gastrointestinal:  Negative for abdominal distention, abdominal pain, constipation, diarrhea, nausea and vomiting.  Musculoskeletal: Negative.   Skin: Negative.   Neurological: Negative.   Psychiatric/Behavioral: Negative.      Objective:  Physical Exam Constitutional:      Appearance: She is well-developed.  HENT:     Head: Normocephalic and atraumatic.  Cardiovascular:     Rate and Rhythm: Normal rate and regular rhythm.  Pulmonary:     Effort: Pulmonary effort is normal. No respiratory distress.     Breath sounds: Normal breath sounds. No wheezing or rales.  Abdominal:     General: Bowel sounds are normal. There is no distension.     Palpations: Abdomen is soft.     Tenderness: There is no abdominal tenderness. There is no rebound.  Musculoskeletal:     Cervical back: Normal range of motion.  Skin:    General: Skin is warm and dry.  Neurological:     Mental Status: She is alert and oriented to person, place, and time.     Coordination: Coordination normal.     Vitals:   08/30/22 0838  BP: 130/82  Pulse: 76  Temp: 98.6 F (37 C)  TempSrc: Oral  SpO2: 98%  Weight: 199 lb (90.3 kg)  Height: 5\' 4"  (1.626 m)    Assessment & Plan:  Shingrix IM given at visit

## 2022-09-05 ENCOUNTER — Ambulatory Visit (INDEPENDENT_AMBULATORY_CARE_PROVIDER_SITE_OTHER): Payer: 59 | Admitting: Dermatology

## 2022-09-05 ENCOUNTER — Encounter: Payer: Self-pay | Admitting: Dermatology

## 2022-09-05 VITALS — BP 155/103 | HR 78

## 2022-09-05 DIAGNOSIS — L918 Other hypertrophic disorders of the skin: Secondary | ICD-10-CM | POA: Diagnosis not present

## 2022-09-05 DIAGNOSIS — L810 Postinflammatory hyperpigmentation: Secondary | ICD-10-CM | POA: Diagnosis not present

## 2022-09-05 MED ORDER — SAFETY SEAL MISCELLANEOUS MISC: 1.0000 | 4 refills | Status: AC

## 2022-09-05 NOTE — Progress Notes (Unsigned)
   New Patient Visit   Subjective  Joyce Russell is a 52 y.o. female who presents for the following: Skin Tag, PIH, Ingrown hairs  Patient states she has Skin Tags, PIH & Ingrown Hairs located at the face that she would like to have examined. Patient reports the areas have been there for  several  year(s). She reports the areas are not bothersome. She states that the areas have not spread. Patient reports has not previously been treated for these areas. 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 Patient denies family history of skin cancer(s).    The following portions of the chart were reviewed this encounter and updated as appropriate: medications, allergies, medical history  Review of Systems:  No other skin or systemic complaints except as noted in HPI or Assessment and Plan.  Objective  Well appearing patient in no apparent distress; mood and affect are within normal limits.  A focused examination was performed of the following areas: Face  Relevant exam findings are noted in the Assessment and Plan.            Assessment & Plan   DPN (Skin Tags) - Fleshy, skin-colored pedunculated papules - Benign appearing.  - Observe. - If desired, they can be removed with an in office procedure that is not covered by insurance. - Please call the clinic if you notice any new or changing lesions. 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 Recommend daily sun protection including sunscreen SPF 30+ to sun-exposed areas. - Recommend treating any itchy or red areas on the skin quickly to prevent new areas of PIH. Treating with prescription medicines such as hydroquinone may help fade  dark spots faster.    Treatment Plan: - Recommended researching Electrolysis for long term treatment - We also discussed starting Spironolactone to help with hormonal hair growth on chin and face if topical regimens don't work - We will prescribe Cache' Anti Aging Cream to apply every other night, patient informed this will be sent to [facility] - Educated stopping the Cache's Anti Aging Cream when doing Electrolysis    Return in about 3 months (around 12/06/2022) for PIH F/U.  Documentation: I have reviewed the above documentation for accuracy and completeness, and I agree with the above.  Stasia Cavalier, am acting as scribe for Langston Reusing, DO.  Langston Reusing, DO

## 2022-09-05 NOTE — Patient Instructions (Addendum)
Hello Miss Mikeila,  Thank you for visiting our clinic today. Your dedication to addressing your skin concerns is greatly appreciated, and I'm pleased we had the opportunity to explore potential treatments for your skin tags, unwanted hair growth and post-inflammatory hyperpigmentation. Here is a summary of the key instructions and recommendations from today's consultation:  - Electrolysis: A more permanent solution for ingrown hairs. While our clinic does not offer this service, it is widely available at most med spas.  - Spironolactone: Discussed as a hormonal management option for unwanted hair growth. It is a weak diuretic, so monitoring blood pressure and potential medication interactions is necessary. Its effectiveness is maintained only with ongoing use.  - Tretinoin Cream: Prescribed for treating bumps and dark spots. It is crucial to discontinue use five days before waxing to avoid skin thinning and tearing.  - Compounding Pharmacy Cream: A blend of tranexamic acid, kojic acid, and niacinamide formulated for dark spots. Apply a pea-sized amount every other night after cleansing your face, followed by a moisturizer. This cream is not covered by insurance and is priced at approximately $45.  Copy: Recommended for safe hair removal, compatible with topical treatments. Further details and a product image will be provided in your after-visit summary.  - Treatment for Skin Tags (Dermatosis Papulosa Nigra): A cosmetic procedure priced around $300. The procedure is scheduled for 12:30 PM, with numbing cream application at 11:45 AM.  - Follow-Up Appointment: We have scheduled a follow-up in three months to assess the effectiveness of the lightening cream and the condition of the bumps.  I look forward to observing the improvements at your next visit. Should you have any questions or concerns before then, please feel free to reach out.  Warm regards,  Dr. Langston Reusing [facility]     Important Information  Due to recent changes in healthcare laws, you may see results of your pathology and/or laboratory studies on MyChart before the doctors have had a chance to review them. We understand that in some cases there may be results that are confusing or concerning to you. Please understand that not all results are received at the same time and often the doctors may need to interpret multiple results in order to provide you with the best plan of care or course of treatment. Therefore, we ask that you please give [place] 2 business days to thoroughly review all your results before contacting the office for clarification. Should we see a critical lab result, you will be contacted sooner.   If You Need Anything After Your Visit  If you have any questions or concerns for your doctor, please call our main line at [phone] If no one answers, please leave a voicemail as directed and we will return your call as soon as possible. Messages left after 4 pm will be answered the following business day.   You may also send [place] a message via MyChart. We typically respond to MyChart messages within 1-2 business days.  For prescription refills, please ask your pharmacy to contact our office. Our fax number is [phone].  If you have an urgent issue when the clinic is closed that cannot wait until the next business day, you can page your doctor at the number below.    Please note that while we do our best to be available for urgent issues outside of office hours, we are not available 24/7.   If you have an urgent issue and are unable to reach [place], you may  choose to seek medical care at your doctor's office, retail clinic, urgent care center, or emergency room.  If you have a medical emergency, please immediately call 911 or go to the emergency department. In the event of inclement weather, please call our main line at [phone] for an update on the status of any delays  or closures.  Dermatology Medication Tips: Please keep the boxes that topical medications come in in order to help keep track of the instructions about where and how to use these. Pharmacies typically print the medication instructions only on the boxes and not directly on the medication tubes.   If your medication is too expensive, please contact our office at [phone] or send [place] a message through MyChart.   We are unable to tell what your co-pay for medications will be in advance as this is different depending on your insurance coverage. However, we may be able to find a substitute medication at lower cost or fill out paperwork to get insurance to cover a needed medication.   If a prior authorization is required to get your medication covered by your insurance company, please allow [place] 1-2 business days to complete this process.  Drug prices often vary depending on where the prescription is filled and some pharmacies may offer cheaper prices.  The website www.goodrx.com contains coupons for medications through different pharmacies. The prices here do not account for what the cost may be with help from insurance (it may be cheaper with your insurance), but the website can give you the price if you did not use any insurance.  - You can print the associated coupon and take it with your prescription to the pharmacy.  - You may also stop by our office during regular business hours and pick up a GoodRx coupon card.  - If you need your prescription sent electronically to a different pharmacy, notify our office through [facility] or by phone at [phone]

## 2022-09-07 ENCOUNTER — Other Ambulatory Visit (HOSPITAL_COMMUNITY): Payer: Self-pay

## 2022-09-08 ENCOUNTER — Other Ambulatory Visit (HOSPITAL_COMMUNITY): Payer: Self-pay

## 2022-09-12 ENCOUNTER — Other Ambulatory Visit (HOSPITAL_BASED_OUTPATIENT_CLINIC_OR_DEPARTMENT_OTHER): Payer: Self-pay

## 2022-09-14 ENCOUNTER — Encounter: Payer: Self-pay | Admitting: Cardiology

## 2022-09-14 ENCOUNTER — Other Ambulatory Visit (HOSPITAL_BASED_OUTPATIENT_CLINIC_OR_DEPARTMENT_OTHER): Payer: Self-pay

## 2022-09-14 ENCOUNTER — Ambulatory Visit: Payer: 59 | Attending: Cardiology | Admitting: Cardiology

## 2022-09-14 VITALS — BP 134/88 | HR 89 | Ht 64.0 in | Wt 197.1 lb

## 2022-09-14 DIAGNOSIS — E782 Mixed hyperlipidemia: Secondary | ICD-10-CM

## 2022-09-14 DIAGNOSIS — E669 Obesity, unspecified: Secondary | ICD-10-CM

## 2022-09-14 DIAGNOSIS — I10 Essential (primary) hypertension: Secondary | ICD-10-CM | POA: Diagnosis not present

## 2022-09-14 MED ORDER — PITAVASTATIN CALCIUM 1 MG PO TABS
1.0000 mg | ORAL_TABLET | Freq: Every day | ORAL | 3 refills | Status: DC
Start: 2022-09-14 — End: 2023-10-24
  Filled 2022-09-14 – 2022-10-17 (×2): qty 90, 90d supply, fill #0
  Filled 2023-01-17: qty 90, 90d supply, fill #1
  Filled 2023-04-25: qty 90, 90d supply, fill #2
  Filled 2023-07-25: qty 90, 90d supply, fill #3

## 2022-09-14 NOTE — Patient Instructions (Signed)
Medication Instructions:  Your physician recommends that you continue on your current medications as directed. Please refer to the Current Medication list given to you today.  *If you need a refill on your cardiac medications before your next appointment, please call your pharmacy*   Lab Work: Your physician recommends that you return for lab work in: 3 months for CMP and lipids. You need to have labs done when you are fasting. [facility] lab is located on the 3rd floor, [address]. Hours are Monday - Friday 8 am to 4 pm, closed 11:30 am to 1:00 pm. You do NOT need an appointment.   If you have labs (blood work) drawn today and your tests are completely normal, you will receive your results only by: MyChart Message (if you have MyChart) OR A paper copy in the mail If you have any lab test that is abnormal or we need to change your treatment, we will call you to review the results.   Testing/Procedures: None ordered   Follow-Up: At [facility], you and your health needs are our priority.  As part of our continuing mission to provide you with exceptional heart care, we have created designated Provider Care Teams.  These Care Teams include your primary Cardiologist (physician) and Advanced Practice Providers (APPs -  Physician Assistants and Nurse Practitioners) who all work together to provide you with the care you need, when you need it.  We recommend signing up for the patient portal called "MyChart".  Sign up information is provided on this After Visit Summary.  MyChart is used to connect with patients for Virtual Visits (Telemedicine).  Patients are able to view lab/test results, encounter notes, upcoming appointments, etc.  Non-urgent messages can be sent to your provider as well.   To learn more about what you can do with MyChart, go to [email or web address].    Your next appointment:   12 month(s)  The format for your next appointment:   In Person  Provider:   Belva Crome, MD    Other Instructions none  Important Information About Sugar

## 2022-09-14 NOTE — Progress Notes (Signed)
Cardiology Office Note:    Date:  09/14/2022   ID:  Joyce Russell, DOB 1970-06-13, MRN [id number]  PCP:  Myrlene Broker, MD  Cardiologist:  Garwin Brothers, MD   Referring MD: Myrlene Broker, *    ASSESSMENT:    1. Mixed hyperlipidemia   2. Essential hypertension   3. Obesity (BMI 30.0-34.9)    PLAN:    In order of problems listed above:  Primary prevention stressed with the patient.  Importance of compliance with diet medication stressed and patient verbalized standing. She is advised to walk at least half an hour a day 5 days a week and she promises to do so. Essential hypertension: Blood pressure stable and diet was emphasized.  Lifestyle modification urged. Mixed dyslipidemia: Lipid-lowering medications.  Lipids not at goal.  She is going to do better with diet and exercise and she has now picked up a gym membership; she will be back for her liver lipid check in 3 months. Obesity: Weight reduction stressors of obesity explained. Patient will be seen in follow-up appointment in 12 months or earlier if the patient has any concerns.    Medication Adjustments/Labs and Tests Ordered: Current medicines are reviewed at length with the patient today.  Concerns regarding medicines are outlined above.  Orders Placed This Encounter  Procedures   EKG 12-Lead   No orders of the defined types were placed in this encounter.    No chief complaint on file.    History of Present Illness:    Joyce Russell is a 52 y.o. female.  Patient has past medical history of essential hypertension, dyslipidemia and obesity.  She mentions to me that she has had significant problems with uterine fibroid in the past several months for which reason she is not being compliant with diet and exercise.  She denies any chest pain orthopnea or PND.  Her lipids are also elevated.  At the time of my evaluation, the patient is alert awake oriented and in no distress.  Past Medical History:   Diagnosis Date   Chronic tension headache    Essential hypertension 02/16/2015   Mixed hyperlipidemia 09/01/2016   Obesity (BMI 30.0-34.9) 06/18/2020   Routine general medical examination at a health care facility 02/16/2015   Uterine leiomyoma 11/06/2018    Past Surgical History:  Procedure Laterality Date   SALPINGECTOMY  03/2010   R   WISDOM TOOTH EXTRACTION      Current Medications: Current Meds  Medication Sig   amLODipine (NORVASC) 5 MG tablet Take 1 tablet (5 mg) by mouth at bedtime. Please call office to schedule follow up visit for further refills.   butalbital-acetaminophen-caffeine (FIORICET) 50-325-40 MG tablet Take 1 - 2 tablets by mouth every 6 hours as needed for headache.   ibuprofen (ADVIL) 600 MG tablet Take 1 tablet by mouth every 6 hours with acetaminophen 1000 mg as needed for pain >3/10   PARAGARD INTRAUTERINE COPPER IU 1 Device by Intrauterine route once.   Pitavastatin Calcium (LIVALO) 1 MG TABS Take 1 tablet (1 mg) by mouth daily. Patient must keep August appointment to obtain additional refills.   Safety Seal Miscellaneous MISC Apply 1 Application topically every other day. Medication Name: Delight [place] Anti-aging Cream     Allergies:   Patient has no known allergies.   Social History   Socioeconomic History   Marital status: Married    Spouse name: Not on file   Number of children: Not on file   Years of  education: Not on file   Highest education level: Not on file  Occupational History   Not on file  Tobacco Use   Smoking status: Never    Passive exposure: Never   Smokeless tobacco: Never  Vaping Use   Vaping status: Never Used  Substance and Sexual Activity   Alcohol use: No   Drug use: No   Sexual activity: Yes    Birth control/protection: I.U.D., Abstinence    Comment: PARAGARD  Other Topics Concern   Not on file  Social History Narrative   Lives with spouse, 3 children and mother in law   CM @ [facility]   Social Determinants of  Health   Financial Resource Strain: Not on file  Food Insecurity: Not on file  Transportation Needs: Not on file  Physical Activity: Not on file  Stress: Not on file  Social Connections: Not on file     Family History: The patient's family history includes Cancer in her paternal grandmother; Diabetes in her father, maternal grandfather, and paternal aunt; Esophageal cancer in her maternal uncle; Glaucoma in her father and mother; Heart disease in her sister; Hypertension in her maternal grandfather, mother, and sister; Stroke (age of onset: 23) in her maternal grandfather. There is no history of Colon cancer, Colon polyps, Stomach cancer, or Rectal cancer.  ROS:   Please see the history of present illness.    All other systems reviewed and are negative.  EKGs/Labs/Other Studies Reviewed:    The following studies were reviewed today: I discussed my findings with the patient at length   Recent Labs: 08/30/2022: ALT 16; BUN 11; Creatinine, Ser 0.67; Hemoglobin 11.1; Platelets 394.0; Potassium 3.8; Sodium 134  Recent Lipid Panel    Component Value Date/Time   CHOL 183 08/30/2022 0908   CHOL 181 03/05/2021 1043   TRIG 76.0 08/30/2022 0908   HDL 38.90 (L) 08/30/2022 0908   HDL 45 03/05/2021 1043   CHOLHDL 5 08/30/2022 0908   VLDL 15.2 08/30/2022 0908   LDLCALC 129 (H) 08/30/2022 0908   LDLCALC 125 (H) 03/05/2021 1043   LDLDIRECT 197.5 10/12/2012 1011    Physical Exam:    VS:  BP 134/88   Pulse 89   Ht 5\' 4"  (1.626 m)   Wt 197 lb 1.9 oz (89.4 kg)   LMP 08/06/2022   SpO2 97%   BMI 33.84 kg/m     Wt Readings from Last 3 Encounters:  09/14/22 197 lb 1.9 oz (89.4 kg)  08/30/22 199 lb (90.3 kg)  07/29/22 201 lb (91.2 kg)     GEN: Patient is in no acute distress HEENT: Normal NECK: No JVD; No carotid bruits LYMPHATICS: No lymphadenopathy CARDIAC: Hear sounds regular, 2/6 systolic murmur at the apex. RESPIRATORY:  Clear to auscultation without rales, wheezing or rhonchi   ABDOMEN: Soft, non-tender, non-distended MUSCULOSKELETAL:  No edema; No deformity  SKIN: Warm and dry NEUROLOGIC:  Alert and oriented x 3 PSYCHIATRIC:  Normal affect   Signed, Garwin Brothers, MD  09/14/2022 9:53 AM    [facility]

## 2022-09-23 ENCOUNTER — Other Ambulatory Visit (HOSPITAL_BASED_OUTPATIENT_CLINIC_OR_DEPARTMENT_OTHER): Payer: Self-pay

## 2022-10-17 ENCOUNTER — Other Ambulatory Visit: Payer: Self-pay

## 2022-10-17 ENCOUNTER — Other Ambulatory Visit (HOSPITAL_BASED_OUTPATIENT_CLINIC_OR_DEPARTMENT_OTHER): Payer: Self-pay

## 2022-12-06 ENCOUNTER — Ambulatory Visit: Payer: 59 | Admitting: Dermatology

## 2022-12-28 DIAGNOSIS — E782 Mixed hyperlipidemia: Secondary | ICD-10-CM | POA: Diagnosis not present

## 2022-12-29 ENCOUNTER — Telehealth: Payer: Self-pay

## 2022-12-29 DIAGNOSIS — E782 Mixed hyperlipidemia: Secondary | ICD-10-CM

## 2022-12-29 LAB — COMPREHENSIVE METABOLIC PANEL
ALT: 41 [IU]/L — ABNORMAL HIGH (ref 0–32)
AST: 24 [IU]/L (ref 0–40)
Albumin: 4.2 g/dL (ref 3.9–4.9)
Alkaline Phosphatase: 79 [IU]/L (ref 44–121)
BUN/Creatinine Ratio: 15 (ref 9–23)
BUN: 12 mg/dL (ref 6–24)
Bilirubin Total: 0.2 mg/dL (ref 0.0–1.2)
CO2: 24 mmol/L (ref 20–29)
Calcium: 9.3 mg/dL (ref 8.7–10.2)
Chloride: 103 mmol/L (ref 96–106)
Creatinine, Ser: 0.78 mg/dL (ref 0.57–1.00)
Globulin, Total: 3.6 g/dL (ref 1.5–4.5)
Glucose: 82 mg/dL (ref 70–99)
Potassium: 5.2 mmol/L (ref 3.5–5.2)
Sodium: 143 mmol/L (ref 134–144)
Total Protein: 7.8 g/dL (ref 6.0–8.5)
eGFR: 92 mL/min/{1.73_m2} (ref >59)

## 2022-12-29 LAB — LIPID PANEL
Chol/HDL Ratio: 4.8 {ratio} — ABNORMAL HIGH (ref 0.0–4.4)
Cholesterol, Total: 192 mg/dL (ref 100–199)
HDL: 40 mg/dL (ref >39)
LDL Chol Calc (NIH): 139 mg/dL — ABNORMAL HIGH (ref 0–99)
Triglycerides: 72 mg/dL (ref 0–149)
VLDL Cholesterol Cal: 13 mg/dL (ref 5–40)

## 2022-12-29 NOTE — Telephone Encounter (Signed)
myChart message

## 2022-12-29 NOTE — Telephone Encounter (Signed)
-----   Message from Aundra Dubin Revankar sent at 12/29/2022  8:22 AM EST ----- Markedly elevated lipids.  Diet and excise.  Recheck in 4 months.  Copy primary Garwin Brothers, MD 12/29/2022 8:22 AM

## 2023-01-04 ENCOUNTER — Ambulatory Visit (INDEPENDENT_AMBULATORY_CARE_PROVIDER_SITE_OTHER): Payer: 59

## 2023-01-04 DIAGNOSIS — Z23 Encounter for immunization: Secondary | ICD-10-CM | POA: Diagnosis not present

## 2023-01-04 NOTE — Progress Notes (Signed)
Per orders of Hillard Danker, MD, injection of Shingles  given by Dorris Fetch in right deltoid. Patient tolerated injection well.

## 2023-01-17 ENCOUNTER — Other Ambulatory Visit (HOSPITAL_BASED_OUTPATIENT_CLINIC_OR_DEPARTMENT_OTHER): Payer: Self-pay

## 2023-01-19 ENCOUNTER — Other Ambulatory Visit (HOSPITAL_BASED_OUTPATIENT_CLINIC_OR_DEPARTMENT_OTHER): Payer: Self-pay

## 2023-01-20 ENCOUNTER — Other Ambulatory Visit (HOSPITAL_BASED_OUTPATIENT_CLINIC_OR_DEPARTMENT_OTHER): Payer: Self-pay

## 2023-03-14 ENCOUNTER — Other Ambulatory Visit: Payer: Self-pay | Admitting: Obstetrics and Gynecology

## 2023-03-14 DIAGNOSIS — Z1231 Encounter for screening mammogram for malignant neoplasm of breast: Secondary | ICD-10-CM

## 2023-04-24 ENCOUNTER — Ambulatory Visit
Admission: RE | Admit: 2023-04-24 | Discharge: 2023-04-24 | Disposition: A | Discharge status: Home / Self Care | Source: Ambulatory Visit | Attending: Obstetrics and Gynecology | Admitting: Obstetrics and Gynecology

## 2023-04-24 DIAGNOSIS — Z1231 Encounter for screening mammogram for malignant neoplasm of breast: Secondary | ICD-10-CM | POA: Diagnosis not present

## 2023-04-25 ENCOUNTER — Other Ambulatory Visit: Payer: Self-pay | Admitting: Internal Medicine

## 2023-04-25 ENCOUNTER — Other Ambulatory Visit (HOSPITAL_BASED_OUTPATIENT_CLINIC_OR_DEPARTMENT_OTHER): Payer: Self-pay

## 2023-04-25 DIAGNOSIS — Z6833 Body mass index (BMI) 33.0-33.9, adult: Secondary | ICD-10-CM | POA: Diagnosis not present

## 2023-04-25 DIAGNOSIS — Z1330 Encounter for screening examination for mental health and behavioral disorders, unspecified: Secondary | ICD-10-CM | POA: Diagnosis not present

## 2023-04-25 DIAGNOSIS — Z1211 Encounter for screening for malignant neoplasm of colon: Secondary | ICD-10-CM | POA: Diagnosis not present

## 2023-04-25 DIAGNOSIS — Z1231 Encounter for screening mammogram for malignant neoplasm of breast: Secondary | ICD-10-CM | POA: Diagnosis not present

## 2023-04-25 DIAGNOSIS — D259 Leiomyoma of uterus, unspecified: Secondary | ICD-10-CM | POA: Diagnosis not present

## 2023-04-25 DIAGNOSIS — Z01419 Encounter for gynecological examination (general) (routine) without abnormal findings: Secondary | ICD-10-CM | POA: Diagnosis not present

## 2023-04-25 DIAGNOSIS — Z3040 Encounter for surveillance of contraceptives, unspecified: Secondary | ICD-10-CM | POA: Diagnosis not present

## 2023-04-25 MED ORDER — SLYND 4 MG PO TABS
4.0000 mg | ORAL_TABLET | Freq: Every day | ORAL | 4 refills | Status: AC
  Filled 2023-04-25: qty 84, 84d supply, fill #0

## 2023-04-26 ENCOUNTER — Other Ambulatory Visit: Payer: Self-pay | Admitting: Obstetrics and Gynecology

## 2023-04-26 DIAGNOSIS — R928 Other abnormal and inconclusive findings on diagnostic imaging of breast: Secondary | ICD-10-CM

## 2023-04-27 ENCOUNTER — Other Ambulatory Visit (HOSPITAL_BASED_OUTPATIENT_CLINIC_OR_DEPARTMENT_OTHER): Payer: Self-pay

## 2023-04-27 MED ORDER — BUTALBITAL-APAP-CAFFEINE 50-325-40 MG PO TABS
1.0000 | ORAL_TABLET | Freq: Four times a day (QID) | ORAL | 0 refills | Status: AC | PRN
  Filled 2023-04-27: qty 30, 4d supply, fill #0

## 2023-04-27 NOTE — Telephone Encounter (Signed)
Last OV- 08/31/2022

## 2023-05-09 ENCOUNTER — Other Ambulatory Visit: Payer: Self-pay | Admitting: Obstetrics and Gynecology

## 2023-05-09 ENCOUNTER — Ambulatory Visit
Admission: RE | Admit: 2023-05-09 | Discharge: 2023-05-09 | Disposition: A | Discharge status: Home / Self Care | Source: Ambulatory Visit | Attending: Obstetrics and Gynecology | Admitting: Obstetrics and Gynecology

## 2023-05-09 DIAGNOSIS — R928 Other abnormal and inconclusive findings on diagnostic imaging of breast: Secondary | ICD-10-CM

## 2023-05-09 DIAGNOSIS — N6311 Unspecified lump in the right breast, upper outer quadrant: Secondary | ICD-10-CM | POA: Diagnosis not present

## 2023-05-09 DIAGNOSIS — N6310 Unspecified lump in the right breast, unspecified quadrant: Secondary | ICD-10-CM

## 2023-05-15 ENCOUNTER — Ambulatory Visit
Admission: RE | Admit: 2023-05-15 | Discharge: 2023-05-15 | Disposition: A | Discharge status: Home / Self Care | Source: Ambulatory Visit | Attending: Obstetrics and Gynecology | Admitting: Obstetrics and Gynecology

## 2023-05-15 DIAGNOSIS — N6310 Unspecified lump in the right breast, unspecified quadrant: Secondary | ICD-10-CM

## 2023-05-15 DIAGNOSIS — N6011 Diffuse cystic mastopathy of right breast: Secondary | ICD-10-CM | POA: Diagnosis not present

## 2023-05-15 DIAGNOSIS — N6311 Unspecified lump in the right breast, upper outer quadrant: Secondary | ICD-10-CM | POA: Diagnosis not present

## 2023-05-15 DIAGNOSIS — R92321 Mammographic fibroglandular density, right breast: Secondary | ICD-10-CM | POA: Diagnosis not present

## 2023-05-15 DIAGNOSIS — N6315 Unspecified lump in the right breast, overlapping quadrants: Secondary | ICD-10-CM | POA: Diagnosis not present

## 2023-05-15 HISTORY — PX: BREAST BIOPSY: SHX20

## 2023-05-16 LAB — SURGICAL PATHOLOGY

## 2023-07-05 ENCOUNTER — Other Ambulatory Visit (HOSPITAL_BASED_OUTPATIENT_CLINIC_OR_DEPARTMENT_OTHER): Payer: Self-pay

## 2023-07-05 DIAGNOSIS — Z113 Encounter for screening for infections with a predominantly sexual mode of transmission: Secondary | ICD-10-CM | POA: Diagnosis not present

## 2023-07-05 MED ORDER — PARAGARD INTRAUTERINE COPPER IU IUD: INTRAUTERINE_SYSTEM | INTRAUTERINE | 0 refills | Status: DC

## 2023-07-11 DIAGNOSIS — Z30431 Encounter for routine checking of intrauterine contraceptive device: Secondary | ICD-10-CM | POA: Diagnosis not present

## 2023-07-25 ENCOUNTER — Other Ambulatory Visit: Payer: Self-pay

## 2023-07-25 ENCOUNTER — Other Ambulatory Visit (HOSPITAL_BASED_OUTPATIENT_CLINIC_OR_DEPARTMENT_OTHER): Payer: Self-pay

## 2023-09-12 ENCOUNTER — Other Ambulatory Visit (HOSPITAL_BASED_OUTPATIENT_CLINIC_OR_DEPARTMENT_OTHER): Payer: Self-pay

## 2023-10-04 ENCOUNTER — Other Ambulatory Visit: Payer: Self-pay | Admitting: Internal Medicine

## 2023-10-04 DIAGNOSIS — R030 Elevated blood-pressure reading, without diagnosis of hypertension: Secondary | ICD-10-CM

## 2023-10-05 ENCOUNTER — Other Ambulatory Visit (HOSPITAL_BASED_OUTPATIENT_CLINIC_OR_DEPARTMENT_OTHER): Payer: Self-pay

## 2023-10-05 MED ORDER — AMLODIPINE BESYLATE 5 MG PO TABS
5.0000 mg | ORAL_TABLET | Freq: Every day | ORAL | 0 refills | Status: DC
  Filled 2023-10-05: qty 90, 90d supply, fill #0

## 2023-10-16 ENCOUNTER — Ambulatory Visit (INDEPENDENT_AMBULATORY_CARE_PROVIDER_SITE_OTHER): Admitting: Internal Medicine

## 2023-10-16 ENCOUNTER — Other Ambulatory Visit (HOSPITAL_BASED_OUTPATIENT_CLINIC_OR_DEPARTMENT_OTHER): Payer: Self-pay

## 2023-10-16 ENCOUNTER — Encounter: Payer: Self-pay | Admitting: Internal Medicine

## 2023-10-16 VITALS — BP 138/100 | HR 82 | Temp 98.5°F | Ht 64.0 in | Wt 187.0 lb

## 2023-10-16 DIAGNOSIS — Z6832 Body mass index (BMI) 32.0-32.9, adult: Secondary | ICD-10-CM | POA: Diagnosis not present

## 2023-10-16 DIAGNOSIS — E782 Mixed hyperlipidemia: Secondary | ICD-10-CM

## 2023-10-16 DIAGNOSIS — Z0001 Encounter for general adult medical examination with abnormal findings: Secondary | ICD-10-CM

## 2023-10-16 DIAGNOSIS — I10 Essential (primary) hypertension: Secondary | ICD-10-CM

## 2023-10-16 DIAGNOSIS — E66811 Obesity, class 1: Secondary | ICD-10-CM | POA: Diagnosis not present

## 2023-10-16 DIAGNOSIS — Z0000 Encounter for general adult medical examination without abnormal findings: Secondary | ICD-10-CM

## 2023-10-16 DIAGNOSIS — G44229 Chronic tension-type headache, not intractable: Secondary | ICD-10-CM

## 2023-10-16 DIAGNOSIS — R030 Elevated blood-pressure reading, without diagnosis of hypertension: Secondary | ICD-10-CM

## 2023-10-16 LAB — LIPID PANEL
Cholesterol: 177 mg/dL (ref 0–200)
HDL: 46.1 mg/dL (ref >39.00)
LDL Cholesterol: 118 mg/dL — ABNORMAL HIGH (ref 0–99)
NonHDL: 131.15
Total CHOL/HDL Ratio: 4
Triglycerides: 64 mg/dL (ref 0.0–149.0)
VLDL: 12.8 mg/dL (ref 0.0–40.0)

## 2023-10-16 LAB — HEMOGLOBIN A1C: Hgb A1c MFr Bld: 6.2 % (ref 4.6–6.5)

## 2023-10-16 LAB — COMPREHENSIVE METABOLIC PANEL WITH GFR
ALT: 29 U/L (ref 0–35)
AST: 26 U/L (ref 0–37)
Albumin: 4.2 g/dL (ref 3.5–5.2)
Alkaline Phosphatase: 64 U/L (ref 39–117)
BUN: 14 mg/dL (ref 6–23)
CO2: 27 meq/L (ref 19–32)
Calcium: 9.2 mg/dL (ref 8.4–10.5)
Chloride: 104 meq/L (ref 96–112)
Creatinine, Ser: 0.77 mg/dL (ref 0.40–1.20)
GFR: 89.21 mL/min (ref >60.00)
Glucose, Bld: 92 mg/dL (ref 70–99)
Potassium: 3.9 meq/L (ref 3.5–5.1)
Sodium: 138 meq/L (ref 135–145)
Total Bilirubin: 0.3 mg/dL (ref 0.2–1.2)
Total Protein: 8.3 g/dL (ref 6.0–8.3)

## 2023-10-16 LAB — CBC
HCT: 33.3 % — ABNORMAL LOW (ref 36.0–46.0)
Hemoglobin: 10.4 g/dL — ABNORMAL LOW (ref 12.0–15.0)
MCHC: 31.1 g/dL (ref 30.0–36.0)
MCV: 67 fl — ABNORMAL LOW (ref 78.0–100.0)
Platelets: 369 K/uL (ref 150.0–400.0)
RBC: 4.97 Mil/uL (ref 3.87–5.11)
RDW: 19.3 % — ABNORMAL HIGH (ref 11.5–15.5)
WBC: 4.5 K/uL (ref 4.0–10.5)

## 2023-10-16 MED ORDER — AMLODIPINE BESYLATE 5 MG PO TABS
5.0000 mg | ORAL_TABLET | Freq: Every day | ORAL | 3 refills | Status: AC
  Filled 2023-10-16 – 2024-01-03 (×2): qty 90, 90d supply, fill #0

## 2023-10-16 NOTE — Progress Notes (Unsigned)
   Subjective:   Patient ID: Joyce Russell, female    DOB: Jun 03, 1970, 53 y.o.   MRN: [id number]  The patient is here for physical. Pertinent topics discussed: Discussed the use of AI scribe software for clinical note transcription with the patient, who gave verbal consent to proceed.  History of Present Illness Joyce Russell is a 53 year old female who presents for a routine follow-up visit.  She has a history of an abnormal mammogram that led to a biopsy in April 2025, which was negative. This occurred during a stressful period as she was completing her graduate studies.  She has fibroids causing discomfort and frequent bleeding. She prefers to wait for menopause rather than undergo a hysterectomy. She manages symptoms while working from home, despite frequent bathroom visits.  She is actively involved in the care of her mother-in-law, diagnosed with rectal cancer in fall 2023. Her mother-in-law initially did not receive necessary care due to denial but has since moved in with her and is undergoing radiation therapy, with 9 treatments remaining out of 28.  She focuses on weight management and physical activity, having lost 12 pounds over the past year. 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 She experiences muscle soreness after workouts but no significant joint issues.  No new chest pain, tightness, pressure, breathing problems, gastrointestinal issues, or new skin changes. Occasional swelling in her feet when standing for long periods. No history of smoking and is up to date on vaccinations, including shingles and tetanus.  PMH, [facility], social history reviewed and updated  Review of Systems  Constitutional: Negative.   HENT: Negative.    Eyes: Negative.   Respiratory:  Negative for cough, chest tightness and shortness of breath.   Cardiovascular:  Negative for chest pain, palpitations and leg swelling.   Gastrointestinal:  Negative for abdominal distention, abdominal pain, constipation, diarrhea, nausea and vomiting.  Musculoskeletal: Negative.   Skin: Negative.   Neurological: Negative.   Psychiatric/Behavioral: Negative.      Objective:  Physical Exam Constitutional:      Appearance: She is well-developed.  HENT:     Head: Normocephalic and atraumatic.  Cardiovascular:     Rate and Rhythm: Normal rate and regular rhythm.  Pulmonary:     Effort: Pulmonary effort is normal. No respiratory distress.     Breath sounds: Normal breath sounds. No wheezing or rales.  Abdominal:     General: Bowel sounds are normal. There is no distension.     Palpations: Abdomen is soft.     Tenderness: There is no abdominal tenderness.  Musculoskeletal:     Cervical back: Normal range of motion.  Skin:    General: Skin is warm and dry.  Neurological:     Mental Status: She is alert and oriented to person, place, and time.     Coordination: Coordination normal.     Vitals:   10/16/23 0844 10/16/23 0846  BP: (!) 138/100 (!) 138/100  Pulse: 82   Temp: 98.5 F (36.9 C)   TempSrc: Oral   SpO2: 97%   Weight: 187 lb (84.8 kg)   Height: 5' 4 (1.626 m)     Assessment & Plan:

## 2023-10-19 ENCOUNTER — Ambulatory Visit: Payer: Self-pay | Admitting: Internal Medicine

## 2023-10-19 NOTE — Assessment & Plan Note (Signed)
 BP elevated today which is unusual for her. Continue amlodipine  and monitor BP closely at home and let us  know readings. Checking CMP and CBC and lipid panel.

## 2023-10-19 NOTE — Assessment & Plan Note (Signed)
 Flu shot yearly. Pneumonia counseled. Shingrix counseled. Tetanus up to date. Colonoscopy up to date. Mammogram up to date, pap smear up to date. Counseled about sun safety and mole surveillance. Counseled about the dangers of distracted driving. Given 10 year screening recommendations.

## 2023-10-19 NOTE — Assessment & Plan Note (Signed)
 Overall stable and using fiorcet rarely for headaches. Can refill if/when needed.

## 2023-10-19 NOTE — Assessment & Plan Note (Signed)
 Checking lipid panel and adjust pitavastatin  as needed.

## 2023-10-19 NOTE — Assessment & Plan Note (Signed)
 Weight is down overall and encouraged continued consistency with eating.

## 2023-10-24 ENCOUNTER — Other Ambulatory Visit: Payer: Self-pay | Admitting: Cardiology

## 2023-10-24 DIAGNOSIS — E782 Mixed hyperlipidemia: Secondary | ICD-10-CM

## 2023-10-24 DIAGNOSIS — I10 Essential (primary) hypertension: Secondary | ICD-10-CM

## 2023-10-25 ENCOUNTER — Other Ambulatory Visit (HOSPITAL_BASED_OUTPATIENT_CLINIC_OR_DEPARTMENT_OTHER): Payer: Self-pay

## 2023-10-25 MED ORDER — PITAVASTATIN CALCIUM 1 MG PO TABS
1.0000 mg | ORAL_TABLET | Freq: Every day | ORAL | 0 refills | Status: DC
  Filled 2023-10-25 (×2): qty 49, 49d supply, fill #0

## 2023-12-13 ENCOUNTER — Encounter: Payer: Self-pay | Admitting: Cardiology

## 2023-12-13 ENCOUNTER — Other Ambulatory Visit: Payer: Self-pay

## 2023-12-13 ENCOUNTER — Other Ambulatory Visit (HOSPITAL_BASED_OUTPATIENT_CLINIC_OR_DEPARTMENT_OTHER): Payer: Self-pay

## 2023-12-13 ENCOUNTER — Ambulatory Visit: Attending: Cardiology | Admitting: Cardiology

## 2023-12-13 VITALS — BP 130/82 | HR 84 | Ht 64.0 in | Wt 183.0 lb

## 2023-12-13 DIAGNOSIS — E66811 Obesity, class 1: Secondary | ICD-10-CM | POA: Diagnosis not present

## 2023-12-13 DIAGNOSIS — I10 Essential (primary) hypertension: Secondary | ICD-10-CM | POA: Diagnosis not present

## 2023-12-13 DIAGNOSIS — E782 Mixed hyperlipidemia: Secondary | ICD-10-CM | POA: Diagnosis not present

## 2023-12-13 MED ORDER — PITAVASTATIN CALCIUM 1 MG PO TABS
1.0000 mg | ORAL_TABLET | Freq: Every day | ORAL | 3 refills | Status: AC
Start: 2023-12-13 — End: ?
  Filled 2023-12-13: qty 90, 90d supply, fill #0

## 2023-12-13 NOTE — Progress Notes (Signed)
 Cardiology Office Note:    Date:  12/13/2023   ID:  Joyce Russell, DOB [birth year], MRN [id number]  PCP:  Joyce Almarie LABOR, MD  Cardiologist:  Jennifer JONELLE Crape, MD   Referring MD: Joyce Russell, *    ASSESSMENT:    1. Essential hypertension   2. Mixed hyperlipidemia   3. Obesity (BMI 30.0-34.9)    PLAN:    In order of problems listed above:  Primary prevention stressed with the patient.  Importance of compliance with diet medication stressed and patient verbalized standing. She was advised to walk at least half an hour a day on a daily basis and she promises to do so.  Overall she is exercising 6 to 7 days in a week. Mixed dyslipidemia: On lipid-lowering medications followed by primary care.  Lipids were reviewed.  Told her that I would like to see them to be better she is taking 1 mg of Livalo  and daily basis.  Diet emphasized. Essential hypertension: Blood pressure is stable and diet was emphasized. Obesity: Weight reduction stressed diet emphasized and she promises to do better.  Risks of obesity explained. Patient will be seen in follow-up appointment in 6 months or earlier if the patient has any concerns.    Medication Adjustments/Labs and Tests Ordered: Current medicines are reviewed at length with the patient today.  Concerns regarding medicines are outlined above.  Orders Placed This Encounter  Procedures   EKG 12-Lead   No orders of the defined types were placed in this encounter.    No chief complaint on file.    History of Present Illness:    Joyce Russell is a 53 y.o. female.  Patient has past medical history of essential hypertension, mixed dyslipidemia and obesity.  Her calcium  score was 0 recently on testing.  She denies any chest pain orthopnea or PND.  She takes care of activities of daily living.  She is trying to exercise on a regular basis.  At the time of my evaluation, the patient is alert awake oriented and in no distress.  Past  Medical History:  Diagnosis Date   Chronic tension headache    Essential hypertension 02/16/2015   Mixed hyperlipidemia 09/01/2016   Obesity (BMI 30.0-34.9) 06/18/2020   Routine general medical examination at a health care facility 02/16/2015   Uterine leiomyoma 11/06/2018    Past Surgical History:  Procedure Laterality Date   BREAST BIOPSY Right 05/15/2023   MM RT BREAST BX W LOC DEV 1ST LESION IMAGE BX SPEC STEREO GUIDE 05/15/2023 [facility]   SALPINGECTOMY  03/2010   R   WISDOM TOOTH EXTRACTION      Current Medications: Current Meds  Medication Sig   amLODipine  (NORVASC ) 5 MG tablet Take 1 tablet (5 mg total) by mouth at bedtime.   butalbital -acetaminophen -caffeine  (FIORICET ) 50-325-40 MG tablet Take 1 - 2 tablets by mouth every 6 hours as needed for headache.   ibuprofen  (ADVIL ) 600 MG tablet Take 1 tablet by mouth every 6 hours with acetaminophen  1000 mg as needed for pain >3/10   PARAGARD  INTRAUTERINE COPPER  IU 1 Device by Intrauterine route once.   Pitavastatin  Calcium  (LIVALO ) 1 MG TABS Take 1 tablet (1 mg total) by mouth daily. Patient must keep appointment on 12/13/23 for further refills.   Safety Seal Miscellaneous MISC Apply 1 Application topically every other day. Medication Name: [place] Anti-aging Cream     Allergies:   Patient has no known allergies.   Social History   Socioeconomic History  Marital status: Married    Spouse name: Not on file   Number of children: Not on file   Years of education: Not on file   Highest education level: Not on file  Occupational History   Not on file  Tobacco Use   Smoking status: Never    Passive exposure: Never   Smokeless tobacco: Never  Vaping Use   Vaping status: Never Used  Substance and Sexual Activity   Alcohol use: No   Drug use: No   Sexual activity: Yes    Birth control/protection: I.U.D., Abstinence    Comment: PARAGARD   Other Topics Concern   Not on file  Social History Narrative   Lives with  spouse, 3 children and mother in law   CM @ [facility]   Social Drivers of Health   Financial Resource Strain: Not on file  Food Insecurity: Not on file  Transportation Needs: Not on file  Physical Activity: Not on file  Stress: Not on file  Social Connections: Not on file     Family History: The patient's family history includes Cancer in her paternal grandmother; Diabetes in her father, maternal grandfather, and paternal aunt; Esophageal cancer in her maternal uncle; Glaucoma in her father and mother; Heart disease in her sister; Hypertension in her maternal grandfather, mother, and sister; Stroke (age of onset: 39) in her maternal grandfather. There is no history of Colon cancer, Colon polyps, Stomach cancer, or Rectal cancer.  ROS:   Please see the history of present illness.    All other systems reviewed and are negative.  EKGs/Labs/Other Studies Reviewed:    The following studies were reviewed today: .SABRAEKG Interpretation Date/Time:  Wednesday December 13 2023 08:32:41 EST Ventricular Rate:  84 PR Interval:  180 QRS Duration:  72 QT Interval:  378 QTC Calculation: 446 R Axis:   31  Text Interpretation: Normal sinus rhythm Normal ECG When compared with ECG of 14-Sep-2022 09:10, No significant change was found Confirmed by Edwyna Backers [phone]) on 12/13/2023 8:50:56 AM     Recent Labs: 10/16/2023: ALT 29; BUN 14; Creatinine, Ser 0.77; Hemoglobin 10.4; Platelets 369.0; Potassium 3.9; Sodium 138  Recent Lipid Panel    Component Value Date/Time   CHOL 177 10/16/2023 0913   CHOL 192 12/28/2022 0856   TRIG 64.0 10/16/2023 0913   HDL 46.10 10/16/2023 0913   HDL 40 12/28/2022 0856   CHOLHDL 4 10/16/2023 0913   VLDL 12.8 10/16/2023 0913   LDLCALC 118 (H) 10/16/2023 0913   LDLCALC 139 (H) 12/28/2022 0856   LDLDIRECT 197.5 10/12/2012 1011    Physical Exam:    VS:  BP 130/82   Pulse 84   Ht 5' 4 (1.626 m)   Wt 183 lb (83 kg)   SpO2 98%   BMI 31.41 kg/m     Wt  Readings from Last 3 Encounters:  12/13/23 183 lb (83 kg)  10/16/23 187 lb (84.8 kg)  09/14/22 197 lb 1.9 oz (89.4 kg)     GEN: Patient is in no acute distress HEENT: Normal NECK: No JVD; No carotid bruits LYMPHATICS: No lymphadenopathy CARDIAC: Hear sounds regular, 2/6 systolic murmur at the apex. RESPIRATORY:  Clear to auscultation without rales, wheezing or rhonchi  ABDOMEN: Soft, non-tender, non-distended MUSCULOSKELETAL:  No edema; No deformity  SKIN: Warm and dry NEUROLOGIC:  Alert and oriented x 3 PSYCHIATRIC:  Normal affect   Signed, Backers JONELLE Edwyna, MD  12/13/2023 8:54 AM    [facility]

## 2023-12-13 NOTE — Patient Instructions (Signed)

## 2023-12-15 ENCOUNTER — Other Ambulatory Visit: Payer: Self-pay

## 2024-01-03 ENCOUNTER — Other Ambulatory Visit (HOSPITAL_BASED_OUTPATIENT_CLINIC_OR_DEPARTMENT_OTHER): Payer: Self-pay
